# Patient Record
Sex: Male | Born: 1940 | Race: Black or African American | Hispanic: No | Smoking: Former smoker
Health system: Southern US, Community
[De-identification: ages and names within clinical notes are randomized; demographics above are authoritative.]

## PROBLEM LIST (undated history)

## (undated) DIAGNOSIS — N184 Chronic kidney disease, stage 4 (severe): Secondary | ICD-10-CM

## (undated) DIAGNOSIS — J449 Chronic obstructive pulmonary disease, unspecified: Secondary | ICD-10-CM

## (undated) DIAGNOSIS — Z8673 Personal history of transient ischemic attack (TIA), and cerebral infarction without residual deficits: Secondary | ICD-10-CM

## (undated) DIAGNOSIS — I5022 Chronic systolic (congestive) heart failure: Secondary | ICD-10-CM

## (undated) DIAGNOSIS — I10 Essential (primary) hypertension: Secondary | ICD-10-CM

## (undated) HISTORY — DX: Chronic kidney disease, stage 4 (severe): N18.4

## (undated) HISTORY — DX: Chronic systolic (congestive) heart failure: I50.22

## (undated) HISTORY — DX: Personal history of transient ischemic attack (TIA), and cerebral infarction without residual deficits: Z86.73

---

## 2005-02-08 ENCOUNTER — Other Ambulatory Visit: Payer: Self-pay

## 2005-02-08 ENCOUNTER — Emergency Department: Payer: Self-pay | Admitting: Unknown Physician Specialty

## 2008-06-05 ENCOUNTER — Emergency Department: Payer: Self-pay | Admitting: Emergency Medicine

## 2008-06-06 ENCOUNTER — Emergency Department: Payer: Self-pay | Admitting: Emergency Medicine

## 2008-08-25 ENCOUNTER — Emergency Department: Payer: Self-pay | Admitting: Emergency Medicine

## 2009-12-30 ENCOUNTER — Inpatient Hospital Stay: Payer: Medicare Other | Admitting: Internal Medicine

## 2010-01-01 ENCOUNTER — Encounter: Payer: Self-pay | Admitting: Cardiovascular Disease

## 2010-01-07 ENCOUNTER — Ambulatory Visit: Payer: Self-pay | Admitting: Cardiovascular Disease

## 2010-01-07 DIAGNOSIS — R0602 Shortness of breath: Secondary | ICD-10-CM | POA: Insufficient documentation

## 2010-01-07 DIAGNOSIS — I429 Cardiomyopathy, unspecified: Secondary | ICD-10-CM | POA: Insufficient documentation

## 2010-01-07 DIAGNOSIS — R9431 Abnormal electrocardiogram [ECG] [EKG]: Secondary | ICD-10-CM | POA: Insufficient documentation

## 2010-01-12 ENCOUNTER — Emergency Department: Payer: Medicare Other | Admitting: Emergency Medicine

## 2010-02-07 ENCOUNTER — Emergency Department: Payer: Medicare Other | Admitting: Emergency Medicine

## 2010-02-10 ENCOUNTER — Inpatient Hospital Stay: Payer: Medicare Other | Admitting: Internal Medicine

## 2010-02-10 ENCOUNTER — Ambulatory Visit: Payer: Self-pay | Admitting: Cardiovascular Disease

## 2010-02-11 ENCOUNTER — Encounter: Payer: Self-pay | Admitting: Cardiovascular Disease

## 2010-02-12 ENCOUNTER — Encounter: Payer: Self-pay | Admitting: Cardiovascular Disease

## 2010-02-19 ENCOUNTER — Ambulatory Visit: Payer: Self-pay | Admitting: Cardiovascular Disease

## 2010-02-19 DIAGNOSIS — I2589 Other forms of chronic ischemic heart disease: Secondary | ICD-10-CM | POA: Insufficient documentation

## 2010-02-21 ENCOUNTER — Telehealth (INDEPENDENT_AMBULATORY_CARE_PROVIDER_SITE_OTHER): Payer: Self-pay | Admitting: Physician Assistant

## 2010-02-22 ENCOUNTER — Emergency Department: Payer: Medicare Other | Admitting: Emergency Medicine

## 2010-02-23 ENCOUNTER — Telehealth: Payer: Self-pay | Admitting: Cardiovascular Disease

## 2010-03-16 ENCOUNTER — Telehealth: Payer: Self-pay | Admitting: Cardiovascular Disease

## 2010-03-23 ENCOUNTER — Emergency Department: Payer: Medicare Other | Admitting: Emergency Medicine

## 2010-04-27 ENCOUNTER — Ambulatory Visit: Payer: Self-pay | Admitting: Cardiology

## 2010-04-27 ENCOUNTER — Encounter: Payer: Self-pay | Admitting: Cardiovascular Disease

## 2010-04-27 ENCOUNTER — Inpatient Hospital Stay: Payer: Medicare Other | Admitting: Internal Medicine

## 2010-05-01 ENCOUNTER — Encounter: Payer: Self-pay | Admitting: Cardiovascular Disease

## 2010-05-02 ENCOUNTER — Ambulatory Visit: Payer: Medicare Other | Admitting: Internal Medicine

## 2010-05-06 ENCOUNTER — Telehealth: Payer: Self-pay | Admitting: Cardiovascular Disease

## 2010-05-09 ENCOUNTER — Ambulatory Visit: Payer: Self-pay | Admitting: Cardiovascular Disease

## 2010-05-09 ENCOUNTER — Inpatient Hospital Stay: Payer: Medicare Other | Admitting: Internal Medicine

## 2010-05-10 ENCOUNTER — Encounter: Payer: Self-pay | Admitting: Cardiovascular Disease

## 2010-05-28 ENCOUNTER — Ambulatory Visit: Payer: Self-pay | Admitting: Cardiovascular Disease

## 2010-06-02 ENCOUNTER — Ambulatory Visit: Payer: Medicare Other | Admitting: Internal Medicine

## 2010-06-03 ENCOUNTER — Telehealth: Payer: Self-pay | Admitting: Cardiovascular Disease

## 2010-07-31 ENCOUNTER — Telehealth: Payer: Self-pay | Admitting: Cardiovascular Disease

## 2010-09-01 NOTE — Letter (Signed)
Summary: [facility] - MRI Brain W/o Contrast  [facility] - MRI W/o Contrast   Imported By: Marylou Mccoy 07/10/2010 11:33:33  _____________________________________________________________________  External Attachment:    Type:   Image     Comment:   External Document

## 2010-09-01 NOTE — Letter (Signed)
Summary: [facility] - US Carotid Doppler Bilateral  [facility] - US Carotid Doppler Bilateral   Imported By: Marylou Mccoy 07/10/2010 11:44:17  _____________________________________________________________________  External Attachment:    Type:   Image     Comment:   External Document

## 2010-09-01 NOTE — Progress Notes (Signed)
  Phone Note Call from Patient   Call For: Dr Dossie Arbour Reason for Call: Talk to Doctor Summary of Call: Pt called because he is not sleeping. This is an ongoing problem. His family MD has prescribed Ambien but he says is not working. Advised him to call primary MD about this. HE HAS NOT SMOKED IN 2 DAYS. Congratulated him on this and reinforced compliance with diet/Rx. No cardiac/heart failure issues at this time. Initial call taken by: Park Breed PA-C,  February 21, 2010 9:09 AM

## 2010-09-01 NOTE — Letter (Signed)
SummaryScientist, [facility]   [facility]   Imported By: Roderic Ovens 05/18/2010 10:53:27  _____________________________________________________________________  External Attachment:    Type:   Image     Comment:   External Document

## 2010-09-01 NOTE — Assessment & Plan Note (Signed)
Summary: EPH/GLC   Visit Type:  Follow-up Primary Provider:  no PCP at this time  CC:  s/p hospital visit at [facility]  He was discharged on February 12, 2010.  c/o not being able to sleep, feels drowsy, shortness of breath, and edema in ankles and feet.Marland Kitchen  History of Present Illness: Mr. 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 At that time his ACE inhibitor and Lasix were held. He was recently admitted to [facility] with chest pain and ruled in for a non-STEMI with significant elevation of his troponin to greater than 40.  Echocardiogram at that time showed ejection fraction approximately 10%. He had significant mitral valve regurgitation.he refused cardiac catheterization and stress testing. Stress testing last year at [facility] had shown no ischemia.  He presents today and states that overall he feels well though he does feel fatigue. He is complaining that his blood pressure is a little bit low. He has chronic pains in his thighs when he walks. By his report, he had a lower extremity arterial ultrasound at [facility] but did not show any significant stenoses. he takes Lasix p.r.n. for lower extremity edema. He does not have significant shortness of breath. He continues to smoke.     q Current Medications (verified): 1)  Aspirin Ec 325 Mg Tbec (Aspirin) .... Take One Tablet By Mouth Daily 2)  Carvedilol 12.5 Mg Tabs (Carvedilol) .... One Tablet Two Times A Day 3)  Furosemide 40 Mg Tabs (Furosemide) .Marland Kitchen.. 1 Tab As Needed 4)  Pacerone 200 Mg Tabs (Amiodarone Hcl) .... One Tablet Two Times A Day 5)  Hydralazine Hcl 50 Mg  Tabs (Hydralazine Hcl) .... One Tablet Three Times A Day 6)  Klor-Con 10 10 Meq Cr-Tabs (Potassium Chloride) .... One Tablet Once Daily 7)  Zolpidem Tartrate 5 Mg Tabs (Zolpidem Tartrate) .... 1/2 - 1 Tablet At Bedtime 8)  Isosorbide Mononitrate Cr 30 Mg Xr24h-Tab (Isosorbide Mononitrate) .Marland Kitchen.. 1 Once Daily 9)  Lasix 40 Mg Tabs (Furosemide) .... 1/2 Tablet Once Daily 10)  Nitrostat 0.4 Mg Subl (Nitroglycerin) .... As Needed  Allergies (verified): 1)  ! * Furosemide  Past History:  Past Medical History: Last updated: 01/07/2010 chest pain HTN  Past Surgical History: Last updated: 01/07/2010 never had any surgeries states pt  Family History: Last updated: 01/07/2010 Family History of Hypertension: Brother  Social History: Last updated: 01/07/2010 Retired  Divorced  Tobacco Use - Yes.  Alcohol Use - no Regular Exercise - yes Drug Use - no  Risk Factors: Exercise: yes (01/07/2010)  Risk Factors: Smoking Status: current (01/07/2010)  Review of Systems  The patient denies fever, weight loss, weight gain, vision loss, decreased hearing, hoarseness, chest pain, syncope, dyspnea on exertion, peripheral edema, prolonged cough, abdominal pain, incontinence, muscle weakness, depression, and enlarged lymph nodes.         B/L leg pain/thighs  Vital Signs:  Patient profile:   70 year old male Height:      68 inches Weight:      118 pounds BMI:     18.01 Pulse rate:   53 / minute BP sitting:   113 / 79  (left  arm) Cuff size:   regular  Vitals Entered By: Bishop Dublin, CMA (February 19, 2010 3:52 PM)  Physical Exam  General:  Well developed, well nourished, in no acute distress. Head:  normocephalic and atraumatic Neck:  Neck supple, no JVD. No masses, thyromegaly or abnormal cervical nodes. Chest Wall:  no deformities or breast masses noted Lungs:  mildly decreased breath sounds throughout bilaterally Heart:  Non-displaced PMI, chest non-tender; regular rate and rhythm,  S1, S2 without murmurs, rubs or gallops. Carotid upstroke normal, no bruit.  Pedals normal pulses. Trace edema on the left foot, no varicosities. Abdomen:  Bowel sounds positive; abdomen soft and non-tender without masses Msk:  Back normal, normal gait. Muscle strength and tone normal. Pulses:  pulses normal in all 4 extremities Extremities:  No clubbing or cyanosis. Neurologic:  Alert and oriented x 3. Skin:  Intact without lesions or rashes. Psych:  Normal affect.   Impression & Recommendations:  Problem # 1:  CARDIOMYOPATHY, ISCHEMIC (ICD-414.8) he has a severe ischemic heart disease with ejection fraction of 10%. Recent MI with severe elevation of his troponin, treated medically as he did not want a stress test or cardiac catheterization. He is afraid that any dye will hurt his kidneys and cause him to be on hemodialysis. He is questioning many of his medications we have talked to him at length about each one. We have not made any medication changes at this time. He did have significant nonsustained VT in the hospital he was started on amiodarone.  He denies any further chest pain.  He is reluctant to take a statin given his leg pain. I did mention that if he would like, we could repeat his lower extremity arterial ultrasound to confirm that he has no stenoses. He would like to think about this.  His updated medication list for this problem includes:    Aspirin Ec 325 Mg Tbec (Aspirin) .Marland Kitchen... Take one tablet by mouth daily    Carvedilol 12.5 Mg Tabs (Carvedilol) ..... One tablet two times a day    Furosemide 40 Mg Tabs (Furosemide) .Marland Kitchen... 1 tab as needed    Pacerone 200 Mg Tabs (Amiodarone hcl) ..... One tablet two times a day    Isosorbide Mononitrate Cr 30 Mg Xr24h-tab (Isosorbide mononitrate) .Marland Kitchen... 1 once daily    Lasix 40 Mg Tabs (Furosemide) .Marland Kitchen... 1/2 tablet once daily    Nitrostat 0.4 Mg Subl (Nitroglycerin) .Marland Kitchen... As needed  Other Orders: EKG w/ Interpretation  (93000)  Appended Document: EPH/GLC echocardiogram from July 2010 shows ejection fraction 10%, severe global hypokinesis, RV has moderately reduced systolic function, moderately dilated left atrium, moderate to severe MR, mild to moderate TR, severely elevated right ventricular systolic pressure consistent with severe pulmonary hypertension.  EKG from the hospital dated February 10, 2006 shows normal sinus rhythm, left axis deviation, ST and T wave abnormalities noted in anterolateral leads including B1, aVL

## 2010-09-01 NOTE — Letter (Signed)
Summary: Discharge Summary  Discharge Summary   Imported By: West Carbo 02/16/2010 08:19:07  _____________________________________________________________________  External Attachment:    Type:   Image     Comment:   External Document

## 2010-09-01 NOTE — Letter (Signed)
Summary: MR RELEASE  MR RELEASE   Imported By: Frazier Butt Chriscoe 01/05/2010 09:52:36  _____________________________________________________________________  External Attachment:    Type:   Image     Comment:   External Document

## 2010-09-01 NOTE — Progress Notes (Signed)
Summary: RX  pacerone,isosorbide,carvedilol,   Phone Note Refill Request Call back at Home Phone [phone] Message from:  Patient on March 16, 2010 9:08 AM  Refills Requested: Medication #1:  PACERONE 200 MG TABS one tablet two times a day  Medication #2:  CARVEDILOL 12.5 MG TABS one tablet two times a day  Medication #3:  ISOSORBIDE MONONITRATE CR 30 MG XR24H-TAB Take 1/2 tablet by mouth once a day   Notes: PT WAS TOLD TO CUT THIS IN HALF  Medication #4:  HYDRALAZINE HCL 50 MG TABS one tablet three times a day   Notes: PT WAS TOLD TO CUT TO 25 MG [facility] ON [address]  Initial call taken by: Harlon Flor,  March 16, 2010 9:10 AM    New/Updated Medications: HYDRALAZINE HCL 25 MG TABS (HYDRALAZINE HCL) one tablet three times a day Prescriptions: ISOSORBIDE MONONITRATE CR 30 MG XR24H-TAB (ISOSORBIDE MONONITRATE) Take 1/2 tablet by mouth once a day  #30 x 3   Entered by:   Bishop Dublin, CMA   Authorized by:   Dossie Arbour MD   Signed by:   Bishop Dublin, CMA on 03/16/2010   Method used:   Electronically to        [facility] [address] (retail)       [address]       [place], [place]  [address]       Ph: [phone]       Fax: [phone]   RxID:   [id number] HYDRALAZINE HCL 25 MG TABS (HYDRALAZINE HCL) one tablet three times a day  #90 x 3   Entered by:   Bishop Dublin, CMA   Authorized by:   Dossie Arbour MD   Signed by:   Bishop Dublin, CMA on 03/16/2010   Method used:   Electronically to        [facility] [address] (retail)       [address]       [place], [place]  [address]       Ph: [phone]       Fax: [phone]   RxID:   [id number] PACERONE 200 MG TABS (AMIODARONE HCL) one tablet two times a day  #60 x 6   Entered by:   Bishop Dublin, CMA   Authorized by:   Dossie Arbour MD   Signed by:   Bishop Dublin, CMA on 03/16/2010   Method used:    Electronically to        [facility] [address] (retail)       [address]       [place], [place]  [address]       Ph: [phone]       Fax: [phone]   RxID:   [id number] CARVEDILOL 12.5 MG TABS (CARVEDILOL) one tablet two times a day  #60 x 6   Entered by:   Bishop Dublin, CMA   Authorized by:   Dossie Arbour MD   Signed by:   Bishop Dublin, CMA on 03/16/2010   Method used:   Electronically to        [facility] [address] (retail)       [address]       [place], [place]  [address]  Ph: [phone]       Fax: [phone]   RxID:   [id number]  ]

## 2010-09-01 NOTE — Assessment & Plan Note (Signed)
Summary: F/U [facility]   Visit Type:  Follow-up Primary Provider:  no PCP at this time  CC:  F/U [facility].  Denies dizziness.Chris Howe  History of Present Illness: 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  He reports feeling well. He has no dizziness. He is unaware of which medications he is taking. he is participating in PT and now swallowing a regular diet. Still has some word finding difficulty. denies SOB at rest, some with exertion, no edema. He continues to smoke. Small weight gain.  Carotid u/s showed no significant stenosis  Echocardiogram at that time showed ejection fraction approximately 10%. He had significant mitral valve regurgitation.he refused cardiac catheterization and stress testing. Stress testing last year at [facility] had shown no ischemia.  EKG: NSR with rate of 80 bpm. ST and T wave ABN in leads V4 to V6, II, III, AVF    Current Medications (verified): 1)  Aspirin Ec 325 Mg Tbec (Aspirin) .... Take One Tablet By Mouth Daily 2)  Carvedilol 12.5 Mg Tabs (Carvedilol) .... One Tablet Two Times A Day 3)  Amiodarone Hcl 200 Mg Tabs (Amiodarone Hcl) .... One Tablet Once Daily 4)  Klor-Con 10 10 Meq Cr-Tabs (Potassium Chloride) .... One Tablet Once Daily 5)  Isosorbide Mononitrate Cr 30 Mg Xr24h-Tab (Isosorbide Mononitrate) .... Take One Tablet Once Daily 6)  Lasix 40 Mg Tabs (Furosemide) .... One Tablet Once Daily 7)  Nitrostat 0.4 Mg Subl (Nitroglycerin) .... As  Needed 8)  Combivent 18-103 Mcg/act Aero (Ipratropium-Albuterol) .... Two Puffs Four Times Daily 9)  Zocor 10 Mg Tabs (Simvastatin) .... One Tablet At Bedtime 10)  Gabapentin 300 Mg Caps (Gabapentin) .... One Tablet Three Times A Day 11)  Midodrine Hcl 10 Mg Tabs (Midodrine Hcl) .... One Tablet Three Times A Day As Needed  Allergies (verified): 1)  ! * Furosemide  Past History:  Past Medical History: Last updated: 01/07/2010 chest pain HTN  Past Surgical History: Last updated: 01/07/2010 never had any surgeries states pt  Family History: Last updated: 01/07/2010 Family History of Hypertension: Brother  Social History: Last updated: 01/07/2010 Retired  Divorced  Tobacco Use - Yes.  Alcohol Use - no Regular Exercise - yes Drug Use - no  Risk Factors: Exercise: yes (01/07/2010)  Risk Factors: Smoking Status: current (01/07/2010)  Review of Systems       The patient complains of weight gain/loss and shortness of breath.  The patient denies fever, vision loss, decreased hearing, hoarseness, chest pain, palpitations, prolonged cough, abdominal pain, incontinence, muscle weakness, depression, and enlarged lymph nodes.    Vital Signs:  Patient profile:   70 year old male Height:      68 inches Weight:      116 pounds BMI:     17.70 Pulse rate:   80 / minute BP sitting:   131 / 77  (left arm) Cuff size:   regular  Vitals Entered By: Bishop Dublin, CMA (May 28, 2010 9:54 AM)  Physical Exam  General:  Thin,  ill appearing AA male, some mild speech ABN, walks with walker though can walk without walker.  Head:  normocephalic and atraumatic Neck:  Neck supple, no JVD. No masses, thyromegaly or abnormal cervical nodes. Chest Wall:  no deformities or breast masses noted Lungs:  mildly decreased breath sounds throughout bilaterally Heart:  Non-displaced PMI, chest non-tender; regular rate and rhythm, S1, S2 with II/VI SEM RSB, rubs or gallops. Carotid upstroke normal,  no bruit.  Pedals normal pulses. no edema , no varicosities. Abdomen:  Bowel sounds positive; abdomen soft and non-tender without masses   Thin Msk:  Back normal, normal gait. Muscle strength and tone normal. Pulses:  pulses normal in all 4 extremities Extremities:  No clubbing or cyanosis. Neurologic:  Alert and oriented x 3. Skin:  Intact without lesions or rashes. Psych:  Normal affect.   Impression & Recommendations:  Problem # 1:  CARDIOMYOPATHY, ISCHEMIC (ICD-414.8) severe LV dysfunction. It is uncertain which medications he is taking and we have asked him to call us today when he gets home with the list. He does not appear to be fluid overloaded on clinical exam.  The following medications were removed from the medication list:    Furosemide 40 Mg Tabs (Furosemide) .Chris Howe... 1 tab as needed His updated medication list for this problem includes:    Aspirin Ec 325 Mg Tbec (Aspirin) .Chris Howe... Take one tablet by mouth daily    Carvedilol 12.5 Mg Tabs (Carvedilol) ..... One tablet two times a day    Amiodarone Hcl 200 Mg Tabs (Amiodarone hcl) ..... One tablet once daily    Isosorbide Mononitrate Cr 30 Mg Xr24h-tab (Isosorbide mononitrate) .Chris Howe... Take one tablet once daily    Lasix 40 Mg Tabs (Furosemide) ..... One tablet once daily    Nitrostat 0.4 Mg Subl (Nitroglycerin) .Chris Howe... As needed  Problem # 2:  DYSPNEA (ICD-786.05) His breathing is at his baseline and he denies and dizziness. Unable to adjust meds as we do not know what he is taking. History of noncompliance.  The following medications were removed from the medication list:    Furosemide 40 Mg Tabs (Furosemide) .Chris Howe... 1 tab as needed His updated medication list for this problem includes:    Aspirin Ec 325 Mg Tbec (Aspirin) .Chris Howe... Take one tablet by mouth daily    Carvedilol 12.5 Mg Tabs (Carvedilol) ..... One tablet two times a day    Lasix 40 Mg Tabs (Furosemide) ..... One tablet once daily  Other Orders: EKG w/ Interpretation  (93000)

## 2010-09-01 NOTE — Letter (Signed)
Summary: Historic Patient File  Historic Patient File   Imported By: West Carbo 01/08/2010 09:41:32  _____________________________________________________________________  External Attachment:    Type:   Image     Comment:   External Document

## 2010-09-01 NOTE — Assessment & Plan Note (Signed)
Summary: NEW PT   Visit Type:  new pt visit Primary Provider:  no PCP at this time  CC:  pt quit smoking yesterday...cp...pt c/o leg pain when he walks.  History of Present Illness: Chris Howe is a pleasant 70 year old gentleman with a long history of smoking from age 42, for a total of 50 or more years, chronic renal insufficiency by his report, a cardiomyopathy with ejection fraction of 15% per his report who has been evaluated previously at [facility] for his cardiac issues and is followed at the [facility] with recent evaluation at [facility] for cardiac dysfunction and dehydration. At that time his ACE inhibitor and Lasix were held.  He states that he is unable to tolerate the diuretic and ACE inhibitor. Able to tolerate carvedilol. He does complain of some shortness of breath. He did not want any cardiac catheterization given his renal dysfunction while he was at [facility] and it is uncertain if he completed a stress test to rule out ischemia as a cause of his cardiac dysfunction.   He is now off all diuretic, on no ACE inhibitor and is only taking Coreg and aspirin.  he stopped smoking yesterday.  Preventive Screening-Counseling & Management  Alcohol-Tobacco     Smoking Status: current  Caffeine-Diet-Exercise     Does Patient Exercise: yes      Drug Use:  no.    Current Medications (verified): 1)  Aspirin Ec 325 Mg Tbec (Aspirin) .... Take One Tablet By Mouth Daily 2)  Carvedilol 25 Mg Tabs (Carvedilol) .Marland Kitchen.. 1 Tab Two Times A Day 3)  Furosemide 40 Mg Tabs (Furosemide) .Marland Kitchen.. 1 Tab As Needed  Allergies (verified): 1)  ! * Furosemide  Past History:  Family History: Last updated: 01/07/2010 Family History of Hypertension: Brother  Social History: Last updated: 01/07/2010 Retired  Divorced  Tobacco Use - Yes.  Alcohol Use - no Regular Exercise - yes Drug Use - no  Risk Factors: Exercise: yes (01/07/2010)  Risk Factors: Smoking  Status: current (01/07/2010)  Past Medical History: chest pain HTN  Past Surgical History: never had any surgeries states pt  Family History: Family History of Hypertension: Brother  Social History: Retired  Divorced  Tobacco Use - Yes.  Alcohol Use - no Regular Exercise - yes Drug Use - no Smoking Status:  current Does Patient Exercise:  yes Drug Use:  no  Vital Signs:  Patient profile:   70 year old male Height:      68 inches Weight:      117 pounds BMI:     17.85 Pulse rate:   62 / minute Pulse rhythm:   irregular BP sitting:   117 / 85  (left arm) Cuff size:   regular  Vitals Entered By: Danielle Rankin, CMA (January 07, 2010 10:24 AM)  Physical Exam  General:  Well developed, thin,  in no acute distress. Head:  normocephalic and atraumatic Neck:  Neck supple, no JVD. No masses, thyromegaly or abnormal cervical nodes. Chest Wall:  no deformities or breast masses noted Lungs:  Decreased BS milldy throughtout b/l Heart:  Non-displaced PMI, chest non-tender; regular rate and rhythm, S1, S2 without murmurs, rubs or gallops. Carotid upstroke normal, no bruit.  Pedals normal pulses. No edema, no varicosities. Abdomen:  Bowel sounds positive; abdomen soft and non-tender without masses, thin Msk:  Back normal, normal gait. Muscle strength and tone normal. Pulses:  pulses normal in all 4 extremities Extremities:  No clubbing  or cyanosis. Neurologic:  Alert and oriented x 3. Skin:  Intact without lesions or rashes. Psych:  Normal affect.    EKG  Procedure date:  01/07/2010  Findings:      normal sinus rhythm with rate 62 beats per minute, poor R-wave progression through the precordial leads, ST and T wave abnormality noted in V5, V6, leads 2, 3, aVF  Impression & Recommendations:  Problem # 1:  CARDIOMYOPATHY, SECONDARY (ICD-425.9) patient reports ejection fraction of 15%. It is uncertain if this is a nonischemic or ischemic cardiomyopathy. We will get the records  from [facility] to see if he had a stress test. He refused a catheterization at [facility] by his report as he was worried about his kidney function  We have not made any medication changes at this time and will continue aspirin and carvedilol until we can Review his records.  His updated medication list for this problem includes:    Aspirin Ec 325 Mg Tbec (Aspirin) .Marland Kitchen... Take one tablet by mouth daily    Carvedilol 25 Mg Tabs (Carvedilol) .Marland Kitchen... 1 tab two times a day    Furosemide 40 Mg Tabs (Furosemide) .Marland Kitchen... 1 tab as needed  Problem # 2:  DYSPNEA (ICD-786.05) It is uncertain if his shortness of breath is due to his cardiomyopathy and underlying heart failure or is 50 years of smoking. Per his report, he does not need Lasix as it makes him dehydrated. He only takes this p.r.n.  His updated medication list for this problem includes:    Aspirin Ec 325 Mg Tbec (Aspirin) .Marland Kitchen... Take one tablet by mouth daily    Carvedilol 25 Mg Tabs (Carvedilol) .Marland Kitchen... 1 tab two times a day    Furosemide 40 Mg Tabs (Furosemide) .Marland Kitchen... 1 tab as needed PRN  Problem # 3:  ABNORMAL EKG (ICD-794.31) EKG suggests a cardiomyopathy, LVH. We will try to obtain his echocardiogram.  His updated medication list for this problem includes:    Aspirin Ec 325 Mg Tbec (Aspirin) .Marland Kitchen... Take one tablet by mouth daily    Carvedilol 25 Mg Tabs (Carvedilol) .Marland Kitchen... 1 tab two times a day  Patient Instructions: 1)  Your physician recommends that you schedule a follow-up appointment in: 6 months 2)  Your physician recommends that you continue on your current medications as directed. Please refer to the Current Medication list given to you today.

## 2010-09-01 NOTE — Progress Notes (Signed)
Summary: MEDICATION  Phone Note Call from Patient Call back at Home Phone [phone]   Caller: SELF Call For: [facility] Summary of Call: PT WAS DISCHARGED FROM THE HOSPITAL WITH A MEDICATION THAT HE HAS NEVER TAKEN BEFORE-IMDUR (?)-PT HAS QUESTIONS ABOUT THIS Initial call taken by: Harlon Flor,  May 06, 2010 8:38 AM  Follow-up for Phone Call        Pt questioned if he should be taking imdur. pt seen in office Aug new prescription called to isorobide pt never picked up.  Follow-up by: Benedict Needy, RN,  May 06, 2010 12:30 PM

## 2010-09-01 NOTE — Letter (Signed)
Summary: Historic Patient File  Historic Patient File   Imported By: West Carbo 02/11/2010 09:03:06  _____________________________________________________________________  External Attachment:    Type:   Image     Comment:   External Document

## 2010-09-01 NOTE — Progress Notes (Signed)
Summary: Post hospital   Phone Note Call from Patient   Caller: Patient Call For: Dr. Mariah Milling Summary of Call: Pt called stating that you saw him in the hospital this weekend and he was to call office Monday morning.  He was seen in office last week so he declined making an appointment at this time. How can i help him? Initial call taken by: Benedict Needy, RN,  February 23, 2010 8:08 AM  Follow-up for Phone Call        He was seen in the ER. He did not feel well. Tests confirmed his BP was fine. He wanted lower doses of medication to take. At his request, we cut his hydralazine in 1/2 to 25 mg by mouth three times a day. No follow up needed for 6 months     Appended Document: Post hospital  LMOM TCB  Appended Document: Post hospital  Patient called back still having dizziness with cutting the hydralazine back to 25mg  three times a day.  What to do now?  Appended Document: Post hospital  His blood pressure and heart rate are fine.Uncertain what his dizziness is due to. Could try to cut the isosorbide in 1/2 He has terrible cardiac function and there is no cure as he has refused cardiac cath.  Appended Document: Post hospital     Clinical Lists Changes   Medications: Changed medication from ISOSORBIDE MONONITRATE CR 30 MG XR24H-TAB (ISOSORBIDE MONONITRATE) 1 once daily to ISOSORBIDE MONONITRATE CR 30 MG XR24H-TAB (ISOSORBIDE MONONITRATE) Take 1/2 tablet by mouth once a day  called and spoke to pt is aware of medication change

## 2010-09-01 NOTE — Progress Notes (Signed)
Summary: Medications  Phone Note Outgoing Call   Call placed by: Benedict Needy, RN,  June 03, 2010 10:56 AM Call placed to: Patient Summary of Call: After visit on 10/27 pt was to call office with medications b/c he did not bring the meds or a list into his visit. LMOM TCB  Initial call taken by: Benedict Needy, RN,  June 03, 2010 10:59 AM  Follow-up for Phone Call        Attempted to call pt but message said that voice mail box was full. Benedict Needy, RN  June 04, 2010 11:22 AM   Pt did not have time to talk about medications at this time will call back. Benedict Needy, RN  June 05, 2010 2:17 PM   Called spoke with pt, he is currently residing in a nursing home, and is currently under going rehab.  Will be discharged on Friday and he will call and update med list after discharge. Follow-up by: Cloyde Reams RN,  June 09, 2010 3:29 PM     Appended Document: Medications Patient is now taking Carvedilol 3.125mg  one tablet two times a day, furosemide 40 mg one tablet once daily,  lipitor 40 mg one tablet at bedtime, hydralazine 10 mg one tablet every 6 hours, plavix 75 mg one tablet once daily and the gabapentin 100 mg two capsule three times a day; patient is going to stop the gabapentin because he was sleeping too much.  He would like to know if needs potassium with fluid pill because he was taking before discharged but no potassium on discharge sheet.     Appended Document: Medications Medications stopped aspirin 325mg , isosorbide, pacerone, meclizine.          Appended Document: Medications Would probably start KCL 20 meq daily  Appended Document: Medications Notified patient to start KCL 20 meq one tablet once daily.  Appended Document: Medications Patient notified.  Appended Document: Medications Can we contact [facility]. Are they still following him? [phone] His meds he is taking is different from d/c list from 04/2010 [facility]. If they are  following, did he stop meds on his own/ What is he taking now?  Appended Document: Medications Attempted to contact [facility], LMOM TCB. EWJ  Appended Document: Medications Spoke with [facility], RN pt went to [facility] for rehab after last hospitalization.  Medication list currently per her list is: Coreg 3.125 two times a day, Gabapentin 200mg  three times a day, Hydralazine 10mg  every 6 hrs, lasix 40mg  two times a day, lipitor 40mg  once daily, and Plavix 75mg  once daily s/p discharge home for [facility]'s rehab.  Appended Document: Medications looks good for now.

## 2010-09-01 NOTE — Consult Note (Signed)
Summary: [facility] Consult Note  [facility] Consult Note   Imported By: Roderic Ovens 05/08/2010 13:39:54  _____________________________________________________________________  External Attachment:    Type:   Image     Comment:   External Document

## 2010-09-03 NOTE — Progress Notes (Signed)
Summary: RX  Phone Note Refill Request Call back at Home Phone [phone] Message from:  Patient on July 31, 2010 3:43 PM  Refills Requested: Medication #1:  KLOR-CON 10 10 MEQ CR-TABS one tablet once daily  Medication #2:  HYDRALAZINE 10 MG 1 TABLET EVERY 6 HOURS  Medication #3:  PLAVIX 75 MG  Medication #4:  LIPITOR 40 MG CARVEDILOL 3.125 MG-2 TIMES A DAY.  [facility] ON [address]  Initial call taken by: Harlon Flor,  July 31, 2010 3:45 PM  Follow-up for Phone Call        per phone note on 06/03/10 up to date medication list updated in computer and medications will be called in for refills. Follow-up by: Lysbeth Galas CMA,  July 31, 2010 5:02 PM    New/Updated Medications: CARVEDILOL 3.125 MG TABS (CARVEDILOL) 1 tablet two times a day HYDRALAZINE HCL 10 MG TABS (HYDRALAZINE HCL) 1 tablet every 6 hours PLAVIX 75 MG TABS (CLOPIDOGREL BISULFATE) 1 tablet daily LIPITOR 40 MG TABS (ATORVASTATIN CALCIUM) 1 tablet daily Prescriptions: LIPITOR 40 MG TABS (ATORVASTATIN CALCIUM) 1 tablet daily  #30 x 3   Entered by:   Lysbeth Galas CMA   Authorized by:   Dossie Arbour MD   Signed by:   Lysbeth Galas CMA on 07/31/2010   Method used:   Electronically to        [facility] [address] (retail)       [address]       [place], [place]  [address]       Ph: [phone]       Fax: [phone]   RxID:   [id number] PLAVIX 75 MG TABS (CLOPIDOGREL BISULFATE) 1 tablet daily  #30 x 3   Entered by:   Lysbeth Galas CMA   Authorized by:   Dossie Arbour MD   Signed by:   Lysbeth Galas CMA on 07/31/2010   Method used:   Electronically to        [facility] [address] (retail)       [address]       [place], [place]  [address]       Ph: [phone]       Fax: [phone]   RxID:   [id number] HYDRALAZINE HCL 10 MG TABS (HYDRALAZINE HCL) 1 tablet every 6 hours  #60 x 3   Entered by:    Lysbeth Galas CMA   Authorized by:   Dossie Arbour MD   Signed by:   Lysbeth Galas CMA on 07/31/2010   Method used:   Electronically to        [facility] [address] (retail)       [address]       [place], [place]  [address]       Ph: [phone]       Fax: [phone]   RxID:   [id number] KLOR-CON 10 10 MEQ CR-TABS (POTASSIUM CHLORIDE) one tablet once daily  #30 x 3   Entered by:   Lysbeth Galas CMA   Authorized by:   Dossie Arbour MD   Signed by:   Lysbeth Galas CMA on 07/31/2010   Method used:   Electronically to        [facility] [address] (retail)       [address]  [place], [place]  [address]       Ph: [phone]       Fax: [phone]   RxID:   [id number] CARVEDILOL 3.125 MG TABS (CARVEDILOL) 1 tablet two times a day  #60 x 3   Entered by:   Lysbeth Galas CMA   Authorized by:   Dossie Arbour MD   Signed by:   Lysbeth Galas CMA on 07/31/2010   Method used:   Electronically to        [facility] [address] (retail)       [address]       [place], [place]  [address]       Ph: [phone]       Fax: [phone]   RxID:   [id number]

## 2010-10-14 ENCOUNTER — Telehealth: Payer: Self-pay | Admitting: Cardiovascular Disease

## 2010-10-19 ENCOUNTER — Encounter: Payer: Self-pay | Admitting: Cardiovascular Disease

## 2010-10-20 NOTE — Progress Notes (Signed)
Summary: RX  Phone Note Refill Request Call back at Home Phone [phone] Message from:  Patient on October 14, 2010 10:06 AM  Refills Requested: Medication #1:  COMBIVENT 18-103 MCG/ACT AERO two puffs four times daily [facility] [address]  Initial call taken by: Harlon Flor,  October 14, 2010 10:07 AM  Follow-up for Phone Call        LMOM TCB. Lysbeth Galas CMA  October 14, 2010 2:02 PM   pt notified he needs to contact PCP for refill Lysbeth Galas CMA  October 15, 2010 11:18 AM

## 2010-11-04 ENCOUNTER — Encounter: Payer: Self-pay | Admitting: Cardiovascular Disease

## 2010-11-04 ENCOUNTER — Ambulatory Visit (INDEPENDENT_AMBULATORY_CARE_PROVIDER_SITE_OTHER): Payer: Medicare Other | Admitting: Cardiovascular Disease

## 2010-11-04 DIAGNOSIS — I2589 Other forms of chronic ischemic heart disease: Secondary | ICD-10-CM

## 2010-11-04 DIAGNOSIS — R42 Dizziness and giddiness: Secondary | ICD-10-CM

## 2010-11-04 DIAGNOSIS — I10 Essential (primary) hypertension: Secondary | ICD-10-CM

## 2010-11-04 DIAGNOSIS — IMO0001 Reserved for inherently not codable concepts without codable children: Secondary | ICD-10-CM | POA: Insufficient documentation

## 2010-11-04 DIAGNOSIS — E785 Hyperlipidemia, unspecified: Secondary | ICD-10-CM

## 2010-11-04 DIAGNOSIS — F17200 Nicotine dependence, unspecified, uncomplicated: Secondary | ICD-10-CM

## 2010-11-04 DIAGNOSIS — R0602 Shortness of breath: Secondary | ICD-10-CM

## 2010-11-04 NOTE — Assessment & Plan Note (Signed)
He continues to smoke one pack per day. We have counseled him on smoking cessation.

## 2010-11-04 NOTE — Assessment & Plan Note (Signed)
I suggested that he take his Imdur later in the day in an attempt to improve his dizziness. Uncertain if this is secondary to hypotension. Does not appear to be hypotensive today and he reports having symptoms this morning.

## 2010-11-04 NOTE — Assessment & Plan Note (Signed)
He does have mild shortness of breath. Ejection fraction is 10-15% by history. No signs of heart failure at this time. Continue current regimen.

## 2010-11-04 NOTE — Assessment & Plan Note (Signed)
Would continue Lipitor as he is taking. Goal LDL less than 70.

## 2010-11-04 NOTE — Assessment & Plan Note (Signed)
He has poor compensated ischemic cardiomyopathy. We will continue his current medication regimen. We have stressed medication compliance

## 2010-11-04 NOTE — Progress Notes (Signed)
   Patient ID: Chris Howe, male    DOB: 07-Aug-1940, 70 y.o.   MRN: [id number]  HPI Comments: Chris Howe is a pleasant 69 year old gentleman with a long history of smoking from age 8, for a total of 50 or more years, chronic renal insufficiency, ischemic cardiomyopathy with ejection fraction of 15% who has been evaluated previously at [facility] for his cardiac issues and is followed at the [facility] with evaluation at [facility] for cardiac dysfunction and dehydration, repeat admission for NSTEMI at which time he refused cardiac cath,  repeat admission on Oct 9th 2011 for CVA, Several recent admissions to [facility] early records are unavailable to [place]. He reports having a urine infection.  In the past, he was very medication noncompliant. He reports taking his medications as prescribed from [facility] though he does have dizziness. He has taken his Coreg and hydralazine today though has not taken Imdur. Overall he feels well with no significant shortness of breath and has no complaints. Is able to lie flat, has no edema.  Carotid u/s showed no significant stenosis   He has significant mitral valve regurgitation. he refused cardiac catheterization and stress testing.  Stress testing last year at [facility] had shown no ischemia.  EKG: NSR with rate of 66 bpm. T Wave abnormality in leads V5, V6, 2, 3, aVF, nonspecific ST changes, poor R wave progression through the precordial leads, LVH by voltage criteria     Review of Systems  Constitutional: Negative.   HENT: Negative.   Eyes: Negative.   Respiratory: Negative.   Cardiovascular: Negative.   Gastrointestinal: Negative.   Musculoskeletal: Negative.   Skin: Negative.   Neurological: Positive for dizziness.  Hematological: Negative.   Psychiatric/Behavioral: Negative.   All other systems reviewed and are negative.    BP 122/78  Pulse 66  Ht 5\' 8"  (1.727 m)  Wt 114 lb (51.71 kg)  BMI 17.33 kg/m2   Physical  Exam  Nursing note and vitals reviewed. Constitutional: He is oriented to person, place, and time.       Very thin.  HENT:  Head: Normocephalic.  Nose: Nose normal.  Mouth/Throat: Oropharynx is clear and moist.  Eyes: Conjunctivae are normal. Pupils are equal, round, and reactive to light.  Neck: Normal range of motion. Neck supple. No JVD present.  Cardiovascular: Normal rate, regular rhythm, S1 normal, S2 normal and intact distal pulses.  Exam reveals gallop. Exam reveals no friction rub.   Murmur heard.  Crescendo systolic murmur is present with a grade of 2/6  Pulmonary/Chest: Effort normal and breath sounds normal. No respiratory distress. He has no wheezes. He has no rales. He exhibits no tenderness.  Abdominal: Soft. Bowel sounds are normal. He exhibits no distension. There is no tenderness.  Musculoskeletal: Normal range of motion. He exhibits no edema and no tenderness.  Lymphadenopathy:    He has no cervical adenopathy.  Neurological: He is alert and oriented to person, place, and time. Coordination normal.  Skin: Skin is warm and dry. No rash noted. No erythema.  Psychiatric: He has a normal mood and affect. His behavior is normal. Judgment and thought content normal.           Assessment and Plan

## 2010-11-04 NOTE — Patient Instructions (Signed)
Continue current medications.  Your physician recommends that you schedule a follow-up appointment in: 6 months.  

## 2011-01-18 ENCOUNTER — Emergency Department: Payer: Self-pay | Admitting: Emergency Medicine

## 2011-01-31 ENCOUNTER — Ambulatory Visit: Payer: Medicare Other | Admitting: Internal Medicine

## 2011-01-31 ENCOUNTER — Emergency Department: Payer: Self-pay | Admitting: Emergency Medicine

## 2011-02-01 ENCOUNTER — Other Ambulatory Visit: Payer: Self-pay | Admitting: Cardiovascular Disease

## 2011-02-07 ENCOUNTER — Inpatient Hospital Stay: Payer: Medicare Other | Admitting: Internal Medicine

## 2011-02-07 DIAGNOSIS — R0602 Shortness of breath: Secondary | ICD-10-CM

## 2011-02-07 DIAGNOSIS — I059 Rheumatic mitral valve disease, unspecified: Secondary | ICD-10-CM

## 2011-02-11 ENCOUNTER — Encounter: Payer: Self-pay | Admitting: Cardiovascular Disease

## 2011-02-16 ENCOUNTER — Ambulatory Visit (INDEPENDENT_AMBULATORY_CARE_PROVIDER_SITE_OTHER): Payer: Medicare Other | Admitting: Cardiovascular Disease

## 2011-02-16 ENCOUNTER — Encounter: Payer: Self-pay | Admitting: Cardiovascular Disease

## 2011-02-16 DIAGNOSIS — R42 Dizziness and giddiness: Secondary | ICD-10-CM

## 2011-02-16 DIAGNOSIS — F17200 Nicotine dependence, unspecified, uncomplicated: Secondary | ICD-10-CM

## 2011-02-16 DIAGNOSIS — R0602 Shortness of breath: Secondary | ICD-10-CM

## 2011-02-16 DIAGNOSIS — I2589 Other forms of chronic ischemic heart disease: Secondary | ICD-10-CM

## 2011-02-16 DIAGNOSIS — E785 Hyperlipidemia, unspecified: Secondary | ICD-10-CM

## 2011-02-16 NOTE — Assessment & Plan Note (Signed)
Shortness of breath is currently stable. We've asked him to maintain his current weight and watch his oral fluid intake him a maintain his Lasix dose with extra Lasix for weight increase.

## 2011-02-16 NOTE — Assessment & Plan Note (Signed)
We did talk to him about his smoking. Counseled for at least 10 minutes. Is not interested in patches or chantix.

## 2011-02-16 NOTE — Assessment & Plan Note (Signed)
Etiology of his dizziness is uncertain. Could be possibly secondary to one of the medications. Symptoms are very mild today and he is taking his medications, but did miss a dose today.

## 2011-02-16 NOTE — Progress Notes (Signed)
Patient ID: Chris Howe, male    DOB: 03-02-1941, 70 y.o.   MRN: [id number]  HPI Comments: Chris Howe is a pleasant 70 year old gentleman with a long history of smoking from age 58, for a total of 50 or more years, chronic renal insufficiency, ischemic cardiomyopathy with ejection fraction of 15% who has been evaluated previously at [facility] for his cardiac issues and is followed at the [facility] with evaluation at [facility] for cardiac dysfunction and dehydration, repeat admission for NSTEMI at which time he refused cardiac cath,  repeat admission on Oct 9th 2011 for CVA, Several admissions to [facility]  records are unavailable to [place]. He was recently admitted to a [facility] on July 1st for shortness of breath, Found to have systolic heart failure. Improved symptoms on diuresis.  He reports that he is taking his medications. In the past, he has been periodically compliant with his medications depending on how he feels. He did not take his medications today and reports his blood pressure was elevated yesterday and today. He denies any significant shortness of breath. He has been monitoring his weight at home. He denies any significant lower extremity edema.  Carotid u/s showed no significant stenosis  Echocardiogram showed ejection fraction 15%, severe global hypokinesisess testing.  Stress testing last year at [facility] had shown no ischemia., right ventricle moderately dilated, but trickier systolic function moderately reduced, left atrium moderately dilated, mild to moderate MR, moderate TR, right ventricular systolic pressure estimated at 50-60 mmHg    Outpatient Encounter Prescriptions as of 02/16/2011  Medication Sig Dispense Refill  . ALBUTEROL IN Inhale 0.09 mg into the lungs. Two puffs every two hours as needed for shortness of breath/wheezing        . aspirin 81 MG EC tablet Take 81 mg by mouth daily.        Marland Kitchen atorvastatin (LIPITOR) 40 MG tablet TAKE ONE TABLET  BY MOUTH EVERY DAY  30 tablet  6  . carvedilol (COREG) 12.5 MG tablet Take 12.5 mg by mouth 2 (two) times daily with a meal.        . clopidogrel (PLAVIX) 75 MG tablet Take 75 mg by mouth daily.        Marland Kitchen docusate sodium (COLACE) 100 MG capsule Take 100 mg by mouth 2 (two) times daily.        . furosemide (LASIX) 40 MG tablet Take 40 mg by mouth 1 day or 1 dose. Take one tablet daily.  If weight increased by more than 3 pounds in one day or more than 5 pounds/ week, please take an extra dose of furosemide 40 mg in the afternoon.      . gabapentin (NEURONTIN) 300 MG capsule Take 300 mg by mouth 1 dose over 46 hours.        . hydrALAZINE (APRESOLINE) 50 MG tablet Take 50 mg by mouth 2 (two) times daily.        . isosorbide mononitrate (IMDUR) 60 MG 24 hr tablet Take 60 mg by mouth daily.        Marland Kitchen lisinopril (PRINIVIL,ZESTRIL) 5 MG tablet Take 5 mg by mouth daily.        . nitroGLYCERIN (NITROSTAT) 0.4 MG SL tablet Place 0.4 mg under the tongue every 5 (five) minutes as needed.        . polyethylene glycol powder (MIRALAX) powder Take 17 g by mouth daily.           Review of  Systems  Constitutional: Negative.   HENT: Negative.   Eyes: Negative.   Respiratory: Negative.   Cardiovascular: Negative.   Gastrointestinal: Negative.   Musculoskeletal: Negative.   Skin: Negative.   Hematological: Negative.   Psychiatric/Behavioral: Negative.   All other systems reviewed and are negative.    BP 167/93  Pulse 76  Ht 5' 8.5" (1.74 m)  Wt 116 lb (52.617 kg)  BMI 17.38 kg/m2   Physical Exam  Nursing note and vitals reviewed. Constitutional: He is oriented to person, place, and time. He appears well-developed and well-nourished.       Very thin.  HENT:  Head: Normocephalic.  Nose: Nose normal.  Mouth/Throat: Oropharynx is clear and moist.  Eyes: Conjunctivae are normal. Pupils are equal, round, and reactive to light.  Neck: Normal range of motion. Neck supple. No JVD present.    Cardiovascular: Normal rate, regular rhythm, S1 normal, S2 normal and intact distal pulses.  Exam reveals no friction rub.   Murmur heard.  Crescendo systolic murmur is present with a grade of 2/6  Pulmonary/Chest: Effort normal and breath sounds normal. No respiratory distress. He has no wheezes. He has no rales. He exhibits no tenderness.  Abdominal: Soft. Bowel sounds are normal. He exhibits no distension. There is no tenderness.  Musculoskeletal: Normal range of motion. He exhibits no edema and no tenderness.  Lymphadenopathy:    He has no cervical adenopathy.  Neurological: He is alert and oriented to person, place, and time. Coordination normal.  Skin: Skin is warm and dry. No rash noted. No erythema.  Psychiatric: He has a normal mood and affect. His behavior is normal. Judgment and thought content normal.           Assessment and Plan

## 2011-02-16 NOTE — Assessment & Plan Note (Signed)
He has refused invasive workup in the past. Now with ischemic myopathy. Recent admission for systolic heart failure. Improved after diuresis and restarting his medications. He is very medication noncompliant, reporting that the medications make him dizzy despite adequate blood pressure and heart rate.

## 2011-02-16 NOTE — Patient Instructions (Signed)
You are doing well. No medication changes were made. Please call us if you have new issues that need to be addressed before your next appt.  We will call you for a follow up Appt. In 6 months  

## 2011-02-16 NOTE — Assessment & Plan Note (Signed)
We have encouraged him to stay on his cholesterol medication.

## 2011-02-17 ENCOUNTER — Other Ambulatory Visit: Payer: Self-pay | Admitting: Cardiovascular Disease

## 2011-03-03 ENCOUNTER — Ambulatory Visit: Payer: Medicare Other | Admitting: Internal Medicine

## 2011-03-03 ENCOUNTER — Emergency Department: Payer: Medicare Other | Admitting: Emergency Medicine

## 2011-04-21 ENCOUNTER — Telehealth: Payer: Self-pay | Admitting: *Deleted

## 2011-04-21 NOTE — Telephone Encounter (Signed)
Error

## 2011-10-25 ENCOUNTER — Emergency Department: Payer: Self-pay | Admitting: Emergency Medicine

## 2011-11-26 ENCOUNTER — Telehealth: Payer: Self-pay | Admitting: Physician Assistant

## 2011-11-26 NOTE — Telephone Encounter (Signed)
Pt called because he has a 4 day history of weakness and dizziness he feels is secondary to his meds. His weight is down, but not sure how much. His appetite is poor. He feels very bad and has not smiled in a year. He complains of suicidal thoughts and says he has had these symptoms since before he saw Dr Mariah Milling in July 2012. He did not tell Dr Mariah Milling about these problems.   I spent over 30 minutes encouraging the patient to call 911 and seek help at the closest emergency room. I also spoke to the patient's brother to enlist his aid in getting the patient to seek help. The patient stated he was not currently having suicidal thoughts and his brother stated he would try to talk the patient into getting help at the ER. Will send msg to ofc for Monday.

## 2011-12-25 ENCOUNTER — Emergency Department: Payer: Self-pay | Admitting: *Deleted

## 2012-02-08 ENCOUNTER — Inpatient Hospital Stay: Payer: Self-pay | Admitting: Internal Medicine

## 2012-02-08 LAB — COMPREHENSIVE METABOLIC PANEL
Alkaline Phosphatase: 93 U/L (ref 50–136)
BUN: 34 mg/dL — ABNORMAL HIGH (ref 7–18)
Bilirubin,Total: 0.4 mg/dL (ref 0.2–1.0)
Co2: 27 mmol/L (ref 21–32)
Creatinine: 2.69 mg/dL — ABNORMAL HIGH (ref 0.60–1.30)
EGFR (African American): 27 — ABNORMAL LOW
EGFR (Non-African Amer.): 23 — ABNORMAL LOW
Glucose: 116 mg/dL — ABNORMAL HIGH (ref 65–99)
SGOT(AST): 23 U/L (ref 15–37)
SGPT (ALT): 19 U/L
Total Protein: 6.2 g/dL — ABNORMAL LOW (ref 6.4–8.2)

## 2012-02-08 LAB — CBC
HCT: 38.9 % — ABNORMAL LOW (ref 40.0–52.0)
HGB: 12.5 g/dL — ABNORMAL LOW (ref 13.0–18.0)
MCH: 30.5 pg (ref 26.0–34.0)
MCHC: 32.2 g/dL (ref 32.0–36.0)
MCV: 95 fL (ref 80–100)
Platelet: 174 10*3/uL (ref 150–440)
RDW: 15.4 % — ABNORMAL HIGH (ref 11.5–14.5)
WBC: 4.3 10*3/uL (ref 3.8–10.6)

## 2012-02-08 LAB — URINALYSIS, COMPLETE
Bacteria: NONE SEEN
Blood: NEGATIVE
Ketone: NEGATIVE
Leukocyte Esterase: NEGATIVE
Nitrite: NEGATIVE
Protein: 100
Specific Gravity: 1.011 (ref 1.003–1.030)
Squamous Epithelial: 1
WBC UR: 1 /HPF (ref 0–5)

## 2012-02-08 LAB — MAGNESIUM: Magnesium: 2 mg/dL

## 2012-02-09 LAB — TROPONIN I
Troponin-I: 0.04 ng/mL
Troponin-I: 0.05 ng/mL

## 2012-02-09 LAB — CBC WITH DIFFERENTIAL/PLATELET
Basophil %: 0.7 %
Eosinophil #: 0.1 10*3/uL (ref 0.0–0.7)
HCT: 39.9 % — ABNORMAL LOW (ref 40.0–52.0)
HGB: 12.8 g/dL — ABNORMAL LOW (ref 13.0–18.0)
MCH: 30.5 pg (ref 26.0–34.0)
MCHC: 32.2 g/dL (ref 32.0–36.0)
Monocyte #: 0.3 x10 3/mm (ref 0.2–1.0)
Neutrophil #: 2.4 10*3/uL (ref 1.4–6.5)
Neutrophil %: 58.2 %
RDW: 15.4 % — ABNORMAL HIGH (ref 11.5–14.5)

## 2012-02-09 LAB — BASIC METABOLIC PANEL
Anion Gap: 7 (ref 7–16)
BUN: 35 mg/dL — ABNORMAL HIGH (ref 7–18)
Calcium, Total: 8.3 mg/dL — ABNORMAL LOW (ref 8.5–10.1)
Co2: 33 mmol/L — ABNORMAL HIGH (ref 21–32)
Creatinine: 2.82 mg/dL — ABNORMAL HIGH (ref 0.60–1.30)
EGFR (African American): 25 — ABNORMAL LOW
EGFR (Non-African Amer.): 22 — ABNORMAL LOW
Glucose: 115 mg/dL — ABNORMAL HIGH (ref 65–99)

## 2012-02-09 LAB — PHOSPHORUS: Phosphorus: 3.4 mg/dL (ref 2.5–4.9)

## 2012-08-26 ENCOUNTER — Emergency Department: Payer: Self-pay | Admitting: Emergency Medicine

## 2012-08-31 ENCOUNTER — Emergency Department: Payer: Self-pay | Admitting: Emergency Medicine

## 2012-08-31 LAB — URINALYSIS, COMPLETE
Bacteria: NONE SEEN
Bilirubin,UR: NEGATIVE
Nitrite: NEGATIVE
Protein: >=500
RBC,UR: 4 /HPF (ref 0–5)
Specific Gravity: 1.013 (ref 1.003–1.030)

## 2012-08-31 LAB — CK TOTAL AND CKMB (NOT AT ARMC)
CK, Total: 54 U/L (ref 35–232)
CK-MB: 1.1 ng/mL (ref 0.5–3.6)

## 2012-08-31 LAB — COMPREHENSIVE METABOLIC PANEL
Anion Gap: 10 (ref 7–16)
Chloride: 108 mmol/L — ABNORMAL HIGH (ref 98–107)
Co2: 24 mmol/L (ref 21–32)
Creatinine: 3.12 mg/dL — ABNORMAL HIGH (ref 0.60–1.30)
EGFR (African American): 22 — ABNORMAL LOW
EGFR (Non-African Amer.): 19 — ABNORMAL LOW
Osmolality: 307 (ref 275–301)
SGOT(AST): 34 U/L (ref 15–37)
SGPT (ALT): 38 U/L (ref 12–78)
Sodium: 142 mmol/L (ref 136–145)

## 2012-08-31 LAB — CBC
HGB: 12.3 g/dL — ABNORMAL LOW (ref 13.0–18.0)
RBC: 4.4 10*6/uL (ref 4.40–5.90)

## 2012-08-31 LAB — TROPONIN I: Troponin-I: 0.06 ng/mL — ABNORMAL HIGH

## 2012-09-09 ENCOUNTER — Emergency Department: Payer: Self-pay | Admitting: Emergency Medicine

## 2012-09-09 LAB — CBC
HCT: 36.6 % — ABNORMAL LOW (ref 40.0–52.0)
HGB: 11.8 g/dL — ABNORMAL LOW (ref 13.0–18.0)
MCH: 27.7 pg (ref 26.0–34.0)
MCHC: 32.1 g/dL (ref 32.0–36.0)
MCV: 86 fL (ref 80–100)
Platelet: 222 x10 3/mm 3 (ref 150–440)
RBC: 4.25 x10 6/mm 3 — ABNORMAL LOW (ref 4.40–5.90)
RDW: 17.2 % — ABNORMAL HIGH (ref 11.5–14.5)
WBC: 5.7 x10 3/mm 3 (ref 3.8–10.6)

## 2012-09-09 LAB — BASIC METABOLIC PANEL
Anion Gap: 7 (ref 7–16)
BUN: 50 mg/dL — ABNORMAL HIGH (ref 7–18)
Calcium, Total: 8.6 mg/dL (ref 8.5–10.1)
EGFR (African American): 29 — ABNORMAL LOW
EGFR (Non-African Amer.): 25 — ABNORMAL LOW
Osmolality: 298 (ref 275–301)
Sodium: 143 mmol/L (ref 136–145)

## 2012-09-12 ENCOUNTER — Ambulatory Visit (INDEPENDENT_AMBULATORY_CARE_PROVIDER_SITE_OTHER): Payer: Medicare Other | Admitting: Cardiovascular Disease

## 2012-09-12 ENCOUNTER — Encounter: Payer: Self-pay | Admitting: Cardiovascular Disease

## 2012-09-12 VITALS — BP 124/80 | HR 75 | Ht 68.5 in | Wt 111.2 lb

## 2012-09-12 DIAGNOSIS — I509 Heart failure, unspecified: Secondary | ICD-10-CM

## 2012-09-12 DIAGNOSIS — I5022 Chronic systolic (congestive) heart failure: Secondary | ICD-10-CM

## 2012-09-12 DIAGNOSIS — R0602 Shortness of breath: Secondary | ICD-10-CM

## 2012-09-12 DIAGNOSIS — R42 Dizziness and giddiness: Secondary | ICD-10-CM

## 2012-09-12 DIAGNOSIS — F17200 Nicotine dependence, unspecified, uncomplicated: Secondary | ICD-10-CM

## 2012-09-12 DIAGNOSIS — E785 Hyperlipidemia, unspecified: Secondary | ICD-10-CM

## 2012-09-12 DIAGNOSIS — I2589 Other forms of chronic ischemic heart disease: Secondary | ICD-10-CM

## 2012-09-12 NOTE — Assessment & Plan Note (Signed)
We have encouraged him to continue to work on weaning his cigarettes and smoking cessation. He will continue to work on this and does not want any assistance with chantix.  

## 2012-09-12 NOTE — Progress Notes (Signed)
Patient ID: Chris Howe, male    DOB: 04/21/1941, 72 y.o.   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 He was recently admitted to a [facility] on July 1st 2012for shortness of breath, Found to have systolic heart failure. Improved symptoms on diuresis.  Prior history of medication noncompliance, continued smoking, refusing testing such as cardiac cath. It appears that he was seen at [facility] emergency room every 8 2014. BUN and creatinine were 50 and 2.5 at that time Recent admission to [facility] 09/10/2012. Medications were restarted including Coreg, hydralazine, Plavix. He reports having weight gain prior to admission and feels better since discharge. Much of today's visit was spent on heart failure teaching.   Prior Carotid u/s showed no significant stenosis  Prior Echocardiogram showed ejection fraction 15%, severe global hypokinesisess testing.  Stress testing last year at [facility] had shown no ischemia., right ventricle moderately dilated, but trickier systolic function moderately reduced, left atrium moderately dilated, mild to moderate MR, moderate TR, right ventricular systolic pressure estimated at 50-60 mmHg  EKG shows normal sinus rhythm with rate 75 beats per minute with ST and T wave abnormality in the anterolateral leads consistent with anterolateral ischemia  Blood work from [facility] 02/09/2014Shows creatinine 2.34, pro BNP 39,000,    Outpatient Encounter Prescriptions as of 09/12/2012  Medication  Sig Dispense Refill  . ALBUTEROL IN Inhale 0.09 mg into the lungs. Two puffs every two hours as needed for shortness of breath/wheezing        . aspirin 325 MG tablet Take 325 mg by mouth daily.      Marland Kitchen atorvastatin (LIPITOR) 40 MG tablet TAKE ONE TABLET BY MOUTH EVERY DAY  30 tablet  6  . carvedilol (COREG) 25 MG tablet Take 25 mg by mouth 2 (two) times daily with a meal.      . docusate sodium (COLACE) 100 MG capsule Take 100 mg by mouth 2 (two) times daily.        . furosemide (LASIX) 40 MG tablet Take 40 mg by mouth 1 day or 1 dose. Take one tablet daily.  If weight increased by more than 3 pounds in one day or more than 5 pounds/ week, please take an extra dose of furosemide 40 mg in the afternoon.      . gabapentin (NEURONTIN) 300 MG capsule Take 300 mg by mouth 1 dose over 46 hours.        . hydrALAZINE (APRESOLINE) 50 MG tablet Take 50 mg by mouth 3 (three) times daily.       . isosorbide mononitrate (IMDUR) 30 MG 24 hr tablet Take 30 mg by mouth daily.      Marland Kitchen lisinopril (PRINIVIL,ZESTRIL) 20 MG tablet Take 20 mg by mouth daily.      . nitroGLYCERIN (NITROSTAT) 0.4 MG SL tablet Place 0.4 mg under the tongue every 5 (five) minutes as needed.        Marland Kitchen PLAVIX 75 MG tablet TAKE ONE TABLET BY MOUTH EVERY DAY  30 each  6  . polyethylene glycol powder (MIRALAX) powder Take 17 g by mouth daily.          Review of Systems  Constitutional: Negative.   HENT: Negative.   Eyes: Negative.   Respiratory: Negative.   Cardiovascular: Negative.   Gastrointestinal: Negative.   Musculoskeletal: Negative.   Skin: Negative.   Psychiatric/Behavioral: Negative.   All other systems reviewed and are negative.    BP 124/80  Pulse 75  Ht 5' 8.5" (1.74 m)  Wt 111 lb 4 oz (50.463 kg)  BMI 16.67 kg/m2  Physical Exam  Nursing note and vitals reviewed. Constitutional: He is oriented to person, place, and time. He appears well-developed and well-nourished.  Very thin.  HENT:  Head: Normocephalic.   Nose: Nose normal.  Mouth/Throat: Oropharynx is clear and moist.  Eyes: Conjunctivae are normal. Pupils are equal, round, and reactive to light.  Neck: Normal range of motion. Neck supple. No JVD present.  Cardiovascular: Normal rate, regular rhythm, S1 normal, S2 normal and intact distal pulses.  Exam reveals no friction rub.   Murmur heard.  Crescendo systolic murmur is present with a grade of 2/6  Pulmonary/Chest: Effort normal and breath sounds normal. No respiratory distress. He has no wheezes. He has no rales. He exhibits no tenderness.  Abdominal: Soft. Bowel sounds are normal. He exhibits no distension. There is no tenderness.  Musculoskeletal: Normal range of motion. He exhibits no edema and no tenderness.  Lymphadenopathy:    He has no cervical adenopathy.  Neurological: He is alert and oriented to person, place, and time. Coordination normal.  Skin: Skin is warm and dry. No rash noted. No erythema.  Psychiatric: He has a normal mood and affect. His behavior is normal. Judgment and thought content normal.      Assessment and Plan

## 2012-09-12 NOTE — Assessment & Plan Note (Signed)
Long history of smoking, likely underlying severe coronary artery disease. Not a candidate for cardiac catheterization given severe renal dysfunction. Medical management recommended. Currently with no chest pain. We'll continue him on his current medications.

## 2012-09-12 NOTE — Patient Instructions (Addendum)
You are doing well. No medication changes were made.  Watch the weight, take extra lasix in the afternoon for any weight gain  Please call us if you have new issues that need to be addressed before your next appt.  Your physician wants you to follow-up in: 6 months.  You will receive a reminder letter in the mail two months in advance. If you don't receive a letter, please call our office to schedule the follow-up appointment.

## 2012-09-12 NOTE — Assessment & Plan Note (Addendum)
Recurrent admissions for CHF. Medication noncompliance with dietary noncompliance. Much of today's visit was spent on dietary teaching, heart failure management. He will take extra Lasix for any weight gain of 3 pounds.

## 2012-09-12 NOTE — Assessment & Plan Note (Signed)
Continue on his statin

## 2012-12-08 ENCOUNTER — Emergency Department: Payer: Self-pay | Admitting: Emergency Medicine

## 2012-12-08 LAB — URINALYSIS, COMPLETE
Bacteria: NONE SEEN
Bilirubin,UR: NEGATIVE
Glucose,UR: NEGATIVE mg/dL (ref 0–75)
Hyaline Cast: 1
Leukocyte Esterase: NEGATIVE
Nitrite: NEGATIVE
Protein: 100
RBC,UR: 6 /HPF (ref 0–5)

## 2012-12-08 LAB — CBC
MCV: 83 fL (ref 80–100)
Platelet: 210 10*3/uL (ref 150–440)
RDW: 16.8 % — ABNORMAL HIGH (ref 11.5–14.5)
WBC: 4.8 10*3/uL (ref 3.8–10.6)

## 2012-12-08 LAB — COMPREHENSIVE METABOLIC PANEL
Albumin: 3 g/dL — ABNORMAL LOW (ref 3.4–5.0)
Anion Gap: 7 (ref 7–16)
BUN: 48 mg/dL — ABNORMAL HIGH (ref 7–18)
Bilirubin,Total: 0.3 mg/dL (ref 0.2–1.0)
Calcium, Total: 9 mg/dL (ref 8.5–10.1)
Chloride: 105 mmol/L (ref 98–107)
Co2: 29 mmol/L (ref 21–32)
EGFR (African American): 23 — ABNORMAL LOW
Glucose: 89 mg/dL (ref 65–99)
Osmolality: 293 (ref 275–301)
Potassium: 4.4 mmol/L (ref 3.5–5.1)
SGPT (ALT): 16 U/L (ref 12–78)
Sodium: 141 mmol/L (ref 136–145)
Total Protein: 6.7 g/dL (ref 6.4–8.2)

## 2012-12-08 LAB — TROPONIN I: Troponin-I: 0.04 ng/mL

## 2013-03-21 ENCOUNTER — Other Ambulatory Visit: Payer: Self-pay | Admitting: *Deleted

## 2013-03-25 ENCOUNTER — Emergency Department: Payer: Self-pay | Admitting: Emergency Medicine

## 2013-03-26 ENCOUNTER — Other Ambulatory Visit: Payer: Self-pay

## 2013-03-26 MED ORDER — FUROSEMIDE 40 MG PO TABS: 40.0000 mg | ORAL_TABLET | ORAL | Status: DC

## 2013-03-26 MED ORDER — FUROSEMIDE 40 MG PO TABS: 40.0000 mg | ORAL_TABLET | Freq: Every day | ORAL | Status: AC

## 2013-03-26 NOTE — Telephone Encounter (Signed)
Refill sent for Lasix 40 mg take one tablet daily.

## 2013-05-13 ENCOUNTER — Emergency Department: Payer: Self-pay | Admitting: Emergency Medicine

## 2013-05-26 LAB — COMPREHENSIVE METABOLIC PANEL
Alkaline Phosphatase: 107 U/L (ref 50–136)
Anion Gap: 6 — ABNORMAL LOW (ref 7–16)
BUN: 53 mg/dL — ABNORMAL HIGH (ref 7–18)
Bilirubin,Total: 0.5 mg/dL (ref 0.2–1.0)
Calcium, Total: 8.9 mg/dL (ref 8.5–10.1)
Co2: 26 mmol/L (ref 21–32)
Creatinine: 3.11 mg/dL — ABNORMAL HIGH (ref 0.60–1.30)
EGFR (African American): 22 — ABNORMAL LOW
EGFR (Non-African Amer.): 19 — ABNORMAL LOW
Glucose: 95 mg/dL (ref 65–99)
Osmolality: 297 (ref 275–301)
SGPT (ALT): 29 U/L (ref 12–78)
Total Protein: 6.4 g/dL (ref 6.4–8.2)

## 2013-05-26 LAB — CBC
HCT: 34.4 % — ABNORMAL LOW (ref 40.0–52.0)
HGB: 11.2 g/dL — ABNORMAL LOW (ref 13.0–18.0)
MCH: 27.4 pg (ref 26.0–34.0)
MCHC: 32.4 g/dL (ref 32.0–36.0)
MCV: 85 fL (ref 80–100)
Platelet: 73 10*3/uL — ABNORMAL LOW (ref 150–440)

## 2013-05-26 LAB — TROPONIN I: Troponin-I: 0.05 ng/mL

## 2013-05-26 LAB — MAGNESIUM: Magnesium: 2 mg/dL

## 2013-05-27 ENCOUNTER — Inpatient Hospital Stay: Payer: Self-pay | Admitting: Internal Medicine

## 2013-05-27 LAB — URINALYSIS, COMPLETE
Bacteria: NONE SEEN
Bilirubin,UR: NEGATIVE
Blood: NEGATIVE
Ph: 6 (ref 4.5–8.0)
Protein: 100
Specific Gravity: 1.009 (ref 1.003–1.030)
Squamous Epithelial: NONE SEEN
WBC UR: NONE SEEN /HPF (ref 0–5)

## 2013-05-28 LAB — BASIC METABOLIC PANEL
Calcium, Total: 8.2 mg/dL — ABNORMAL LOW (ref 8.5–10.1)
Chloride: 107 mmol/L (ref 98–107)
Co2: 27 mmol/L (ref 21–32)
Creatinine: 3.17 mg/dL — ABNORMAL HIGH (ref 0.60–1.30)
EGFR (African American): 22 — ABNORMAL LOW
Glucose: 186 mg/dL — ABNORMAL HIGH (ref 65–99)

## 2013-06-08 ENCOUNTER — Emergency Department: Payer: Self-pay | Admitting: Emergency Medicine

## 2013-06-08 LAB — CBC
HGB: 10.8 g/dL — ABNORMAL LOW (ref 13.0–18.0)
MCH: 27.9 pg (ref 26.0–34.0)
MCV: 85 fL (ref 80–100)
RBC: 3.86 10*6/uL — ABNORMAL LOW (ref 4.40–5.90)
WBC: 3.2 10*3/uL — ABNORMAL LOW (ref 3.8–10.6)

## 2013-06-08 LAB — PRO B NATRIURETIC PEPTIDE: B-Type Natriuretic Peptide: 28871 pg/mL — ABNORMAL HIGH (ref 0–125)

## 2013-06-08 LAB — COMPREHENSIVE METABOLIC PANEL
Albumin: 2.8 g/dL — ABNORMAL LOW (ref 3.4–5.0)
Anion Gap: 6 — ABNORMAL LOW (ref 7–16)
BUN: 52 mg/dL — ABNORMAL HIGH (ref 7–18)
Bilirubin,Total: 0.2 mg/dL (ref 0.2–1.0)
Chloride: 107 mmol/L (ref 98–107)
Co2: 28 mmol/L (ref 21–32)
EGFR (Non-African Amer.): 20 — ABNORMAL LOW
Glucose: 95 mg/dL (ref 65–99)
Osmolality: 295 (ref 275–301)
SGOT(AST): 16 U/L (ref 15–37)

## 2013-06-08 LAB — CK TOTAL AND CKMB (NOT AT ARMC): CK, Total: 43 U/L (ref 35–232)

## 2013-06-25 ENCOUNTER — Emergency Department: Payer: Self-pay | Admitting: Emergency Medicine

## 2013-07-04 ENCOUNTER — Emergency Department: Payer: Self-pay | Admitting: Emergency Medicine

## 2013-07-04 LAB — CBC WITH DIFFERENTIAL/PLATELET
Basophil %: 0.8 %
Eosinophil #: 0 10*3/uL (ref 0.0–0.7)
Eosinophil %: 0.8 %
HCT: 35.6 % — ABNORMAL LOW (ref 40.0–52.0)
HGB: 11.5 g/dL — ABNORMAL LOW (ref 13.0–18.0)
Lymphocyte #: 0.9 10*3/uL — ABNORMAL LOW (ref 1.0–3.6)
MCH: 28.1 pg (ref 26.0–34.0)
MCHC: 32.3 g/dL (ref 32.0–36.0)
MCV: 87 fL (ref 80–100)
Monocyte #: 0.4 x10 3/mm (ref 0.2–1.0)
Neutrophil %: 68 %
RBC: 4.08 10*6/uL — ABNORMAL LOW (ref 4.40–5.90)
RDW: 17.9 % — ABNORMAL HIGH (ref 11.5–14.5)

## 2013-07-04 LAB — URINALYSIS, COMPLETE
Bacteria: NONE SEEN
Glucose,UR: NEGATIVE mg/dL (ref 0–75)
Ketone: NEGATIVE
Leukocyte Esterase: NEGATIVE
Nitrite: NEGATIVE
Ph: 6 (ref 4.5–8.0)
Protein: 100
RBC,UR: 1 /HPF (ref 0–5)
Specific Gravity: 1.011 (ref 1.003–1.030)
WBC UR: 1 /HPF (ref 0–5)

## 2013-07-04 LAB — LIPASE, BLOOD: Lipase: 255 U/L (ref 73–393)

## 2013-07-05 LAB — COMPREHENSIVE METABOLIC PANEL
Alkaline Phosphatase: 89 U/L
BUN: 79 mg/dL — ABNORMAL HIGH (ref 7–18)
Bilirubin,Total: 0.4 mg/dL (ref 0.2–1.0)
Calcium, Total: 8.2 mg/dL — ABNORMAL LOW (ref 8.5–10.1)
Chloride: 106 mmol/L (ref 98–107)
Co2: 28 mmol/L (ref 21–32)
Potassium: 4.1 mmol/L (ref 3.5–5.1)
SGPT (ALT): 31 U/L (ref 12–78)
Sodium: 139 mmol/L (ref 136–145)

## 2013-07-29 ENCOUNTER — Emergency Department: Payer: Self-pay | Admitting: Emergency Medicine

## 2013-08-02 ENCOUNTER — Ambulatory Visit: Payer: Self-pay | Admitting: Oncology

## 2013-08-03 ENCOUNTER — Emergency Department: Payer: Self-pay | Admitting: Emergency Medicine

## 2013-08-03 LAB — CBC
HCT: 35.1 % — ABNORMAL LOW (ref 40.0–52.0)
HGB: 11.2 g/dL — AB (ref 13.0–18.0)
MCH: 27.5 pg (ref 26.0–34.0)
MCHC: 31.7 g/dL — AB (ref 32.0–36.0)
MCV: 87 fL (ref 80–100)
Platelet: 180 10*3/uL (ref 150–440)
RBC: 4.06 10*6/uL — ABNORMAL LOW (ref 4.40–5.90)
RDW: 17.9 % — ABNORMAL HIGH (ref 11.5–14.5)
WBC: 7.4 10*3/uL (ref 3.8–10.6)

## 2013-08-03 LAB — COMPREHENSIVE METABOLIC PANEL
ALK PHOS: 116 U/L
Albumin: 2.8 g/dL — ABNORMAL LOW (ref 3.4–5.0)
Anion Gap: 7 (ref 7–16)
BUN: 57 mg/dL — AB (ref 7–18)
Bilirubin,Total: 1 mg/dL (ref 0.2–1.0)
CHLORIDE: 109 mmol/L — AB (ref 98–107)
Calcium, Total: 8.9 mg/dL (ref 8.5–10.1)
Co2: 25 mmol/L (ref 21–32)
Creatinine: 3.55 mg/dL — ABNORMAL HIGH (ref 0.60–1.30)
EGFR (African American): 19 — ABNORMAL LOW
EGFR (Non-African Amer.): 16 — ABNORMAL LOW
Glucose: 95 mg/dL (ref 65–99)
Osmolality: 297 (ref 275–301)
Potassium: 4.5 mmol/L (ref 3.5–5.1)
SGOT(AST): 48 U/L — ABNORMAL HIGH (ref 15–37)
SGPT (ALT): 40 U/L (ref 12–78)
Sodium: 141 mmol/L (ref 136–145)
Total Protein: 6.2 g/dL — ABNORMAL LOW (ref 6.4–8.2)

## 2013-08-03 LAB — URINALYSIS, COMPLETE
BILIRUBIN, UR: NEGATIVE
Glucose,UR: NEGATIVE mg/dL (ref 0–75)
KETONE: NEGATIVE
LEUKOCYTE ESTERASE: NEGATIVE
Nitrite: NEGATIVE
Ph: 5 (ref 4.5–8.0)
Protein: >=500
RBC,UR: 144 /HPF (ref 0–5)
SPECIFIC GRAVITY: 1.015 (ref 1.003–1.030)
Squamous Epithelial: 1
WBC UR: 16 /HPF (ref 0–5)

## 2013-08-04 ENCOUNTER — Inpatient Hospital Stay: Payer: Self-pay | Admitting: Internal Medicine

## 2013-08-04 LAB — COMPREHENSIVE METABOLIC PANEL
ALT: 155 U/L — AB (ref 12–78)
Albumin: 2.8 g/dL — ABNORMAL LOW (ref 3.4–5.0)
Alkaline Phosphatase: 109 U/L
Anion Gap: 10 (ref 7–16)
BUN: 82 mg/dL — ABNORMAL HIGH (ref 7–18)
Bilirubin,Total: 0.7 mg/dL (ref 0.2–1.0)
CO2: 23 mmol/L (ref 21–32)
CREATININE: 4.79 mg/dL — AB (ref 0.60–1.30)
Calcium, Total: 8.6 mg/dL (ref 8.5–10.1)
Chloride: 105 mmol/L (ref 98–107)
EGFR (African American): 13 — ABNORMAL LOW
EGFR (Non-African Amer.): 11 — ABNORMAL LOW
Glucose: 93 mg/dL (ref 65–99)
Osmolality: 300 (ref 275–301)
POTASSIUM: 5.4 mmol/L — AB (ref 3.5–5.1)
SGOT(AST): 253 U/L — ABNORMAL HIGH (ref 15–37)
Sodium: 138 mmol/L (ref 136–145)
Total Protein: 6.6 g/dL (ref 6.4–8.2)

## 2013-08-04 LAB — CK-MB
CK-MB: 1 ng/mL (ref 0.5–3.6)
CK-MB: 2.1 ng/mL (ref 0.5–3.6)
CK-MB: 2.7 ng/mL (ref 0.5–3.6)

## 2013-08-04 LAB — TROPONIN I
TROPONIN-I: 0.28 ng/mL — AB
TROPONIN-I: 0.37 ng/mL — AB
Troponin-I: 0.2 ng/mL — ABNORMAL HIGH

## 2013-08-04 LAB — CBC
HCT: 34.8 % — AB (ref 40.0–52.0)
HGB: 11.2 g/dL — AB (ref 13.0–18.0)
MCH: 27.7 pg (ref 26.0–34.0)
MCHC: 32.2 g/dL (ref 32.0–36.0)
MCV: 86 fL (ref 80–100)
PLATELETS: 140 10*3/uL — AB (ref 150–440)
RBC: 4.05 10*6/uL — ABNORMAL LOW (ref 4.40–5.90)
RDW: 18 % — ABNORMAL HIGH (ref 11.5–14.5)
WBC: 8.3 10*3/uL (ref 3.8–10.6)

## 2013-08-04 LAB — CK TOTAL AND CKMB (NOT AT ARMC)
CK, Total: 366 U/L — ABNORMAL HIGH (ref 35–232)
CK-MB: 0.6 ng/mL (ref 0.5–3.6)

## 2013-08-04 LAB — RAPID INFLUENZA A&B ANTIGENS

## 2013-08-05 LAB — COMPREHENSIVE METABOLIC PANEL
ALT: 169 U/L — AB (ref 12–78)
ANION GAP: 9 (ref 7–16)
AST: 227 U/L — AB (ref 15–37)
Albumin: 2.2 g/dL — ABNORMAL LOW (ref 3.4–5.0)
Alkaline Phosphatase: 87 U/L
BUN: 97 mg/dL — ABNORMAL HIGH (ref 7–18)
Bilirubin,Total: 0.5 mg/dL (ref 0.2–1.0)
CO2: 22 mmol/L (ref 21–32)
Calcium, Total: 8 mg/dL — ABNORMAL LOW (ref 8.5–10.1)
Chloride: 106 mmol/L (ref 98–107)
Creatinine: 5.41 mg/dL — ABNORMAL HIGH (ref 0.60–1.30)
EGFR (African American): 11 — ABNORMAL LOW
EGFR (Non-African Amer.): 10 — ABNORMAL LOW
Glucose: 109 mg/dL — ABNORMAL HIGH (ref 65–99)
Osmolality: 305 (ref 275–301)
Potassium: 5.1 mmol/L (ref 3.5–5.1)
Sodium: 137 mmol/L (ref 136–145)
Total Protein: 5.7 g/dL — ABNORMAL LOW (ref 6.4–8.2)

## 2013-08-05 LAB — CBC WITH DIFFERENTIAL/PLATELET
Bands: 53 %
Basophil #: 0.1 10*3/uL (ref 0.0–0.1)
Basophil %: 0.6 %
EOS ABS: 0 10*3/uL (ref 0.0–0.7)
EOS PCT: 0 %
HCT: 33 % — AB (ref 40.0–52.0)
HGB: 10.5 g/dL — AB (ref 13.0–18.0)
Lymphocyte #: 0.7 10*3/uL — ABNORMAL LOW (ref 1.0–3.6)
Lymphocyte %: 4.7 %
Lymphocytes: 2 %
MCH: 27.2 pg (ref 26.0–34.0)
MCHC: 31.8 g/dL — ABNORMAL LOW (ref 32.0–36.0)
MCV: 86 fL (ref 80–100)
MONOS PCT: 4.2 %
Metamyelocyte: 7 %
Monocyte #: 0.6 x10 3/mm (ref 0.2–1.0)
Monocytes: 5 %
NEUTROS ABS: 12.8 10*3/uL — AB (ref 1.4–6.5)
Neutrophil %: 90.5 %
Platelet: 107 10*3/uL — ABNORMAL LOW (ref 150–440)
RBC: 3.86 10*6/uL — ABNORMAL LOW (ref 4.40–5.90)
RDW: 17.6 % — ABNORMAL HIGH (ref 11.5–14.5)
SEGMENTED NEUTROPHILS: 33 %
WBC: 14.1 10*3/uL — ABNORMAL HIGH (ref 3.8–10.6)

## 2013-08-05 LAB — BASIC METABOLIC PANEL
Anion Gap: 10 (ref 7–16)
BUN: 100 mg/dL — ABNORMAL HIGH (ref 7–18)
CALCIUM: 7.8 mg/dL — AB (ref 8.5–10.1)
Chloride: 108 mmol/L — ABNORMAL HIGH (ref 98–107)
Co2: 21 mmol/L (ref 21–32)
Creatinine: 5.55 mg/dL — ABNORMAL HIGH (ref 0.60–1.30)
EGFR (African American): 11 — ABNORMAL LOW
GFR CALC NON AF AMER: 10 — AB
GLUCOSE: 101 mg/dL — AB (ref 65–99)
OSMOLALITY: 309 (ref 275–301)
Potassium: 5.2 mmol/L — ABNORMAL HIGH (ref 3.5–5.1)
SODIUM: 139 mmol/L (ref 136–145)

## 2013-08-05 LAB — URINALYSIS, COMPLETE
BACTERIA: NONE SEEN
Bilirubin,UR: NEGATIVE
Glucose,UR: NEGATIVE mg/dL (ref 0–75)
Granular Cast: 7
Leukocyte Esterase: NEGATIVE
NITRITE: POSITIVE
Ph: 5 (ref 4.5–8.0)
RBC,UR: 15 /HPF (ref 0–5)
SPECIFIC GRAVITY: 1.014 (ref 1.003–1.030)
WBC UR: 4 /HPF (ref 0–5)

## 2013-08-05 LAB — PLATELET COUNT: PLATELETS: 112 10*3/uL — AB (ref 150–440)

## 2013-08-05 LAB — CK-MB: CK-MB: 3.6 ng/mL (ref 0.5–3.6)

## 2013-08-06 LAB — CBC WITH DIFFERENTIAL/PLATELET
Basophil #: 0 10*3/uL (ref 0.0–0.1)
Basophil %: 0.1 %
Eosinophil #: 0.1 10*3/uL (ref 0.0–0.7)
Eosinophil %: 0.8 %
HCT: 32.3 % — ABNORMAL LOW (ref 40.0–52.0)
HGB: 10.3 g/dL — ABNORMAL LOW (ref 13.0–18.0)
Lymphocyte #: 0.3 10*3/uL — ABNORMAL LOW (ref 1.0–3.6)
Lymphocyte %: 2 %
MCH: 27.4 pg (ref 26.0–34.0)
MCHC: 32.1 g/dL (ref 32.0–36.0)
MCV: 85 fL (ref 80–100)
MONO ABS: 0.3 x10 3/mm (ref 0.2–1.0)
Monocyte %: 2.2 %
NEUTROS ABS: 14.9 10*3/uL — AB (ref 1.4–6.5)
Neutrophil %: 94.9 %
PLATELETS: 106 10*3/uL — AB (ref 150–440)
RBC: 3.78 10*6/uL — ABNORMAL LOW (ref 4.40–5.90)
RDW: 17.8 % — ABNORMAL HIGH (ref 11.5–14.5)
WBC: 15.7 10*3/uL — ABNORMAL HIGH (ref 3.8–10.6)

## 2013-08-06 LAB — RENAL FUNCTION PANEL
Albumin: 1.8 g/dL — ABNORMAL LOW (ref 3.4–5.0)
Anion Gap: 10 (ref 7–16)
BUN: 108 mg/dL — AB (ref 7–18)
CALCIUM: 7.8 mg/dL — AB (ref 8.5–10.1)
Chloride: 108 mmol/L — ABNORMAL HIGH (ref 98–107)
Co2: 20 mmol/L — ABNORMAL LOW (ref 21–32)
Creatinine: 5.59 mg/dL — ABNORMAL HIGH (ref 0.60–1.30)
EGFR (Non-African Amer.): 9 — ABNORMAL LOW
GFR CALC AF AMER: 11 — AB
GLUCOSE: 113 mg/dL — AB (ref 65–99)
OSMOLALITY: 311 (ref 275–301)
PHOSPHORUS: 7 mg/dL — AB (ref 2.5–4.9)
POTASSIUM: 4.9 mmol/L (ref 3.5–5.1)
Sodium: 138 mmol/L (ref 136–145)

## 2013-08-07 LAB — BASIC METABOLIC PANEL
Anion Gap: 9 (ref 7–16)
BUN: 121 mg/dL — ABNORMAL HIGH (ref 7–18)
CHLORIDE: 107 mmol/L (ref 98–107)
CO2: 21 mmol/L (ref 21–32)
CREATININE: 5.42 mg/dL — AB (ref 0.60–1.30)
Calcium, Total: 7.8 mg/dL — ABNORMAL LOW (ref 8.5–10.1)
EGFR (African American): 11 — ABNORMAL LOW
GFR CALC NON AF AMER: 10 — AB
GLUCOSE: 128 mg/dL — AB (ref 65–99)
Osmolality: 314 (ref 275–301)
Potassium: 4.7 mmol/L (ref 3.5–5.1)
SODIUM: 137 mmol/L (ref 136–145)

## 2013-08-07 LAB — CBC WITH DIFFERENTIAL/PLATELET
BASOS ABS: 0 10*3/uL (ref 0.0–0.1)
BASOS PCT: 0.2 %
EOS ABS: 0 10*3/uL (ref 0.0–0.7)
Eosinophil %: 0 %
HCT: 31.6 % — ABNORMAL LOW (ref 40.0–52.0)
HGB: 10 g/dL — AB (ref 13.0–18.0)
LYMPHS PCT: 3 %
Lymphocyte #: 0.4 10*3/uL — ABNORMAL LOW (ref 1.0–3.6)
MCH: 27 pg (ref 26.0–34.0)
MCHC: 31.7 g/dL — ABNORMAL LOW (ref 32.0–36.0)
MCV: 85 fL (ref 80–100)
MONO ABS: 0.4 x10 3/mm (ref 0.2–1.0)
MONOS PCT: 2.6 %
Neutrophil #: 13.4 10*3/uL — ABNORMAL HIGH (ref 1.4–6.5)
Neutrophil %: 94.2 %
PLATELETS: 82 10*3/uL — AB (ref 150–440)
RBC: 3.71 10*6/uL — ABNORMAL LOW (ref 4.40–5.90)
RDW: 18.1 % — ABNORMAL HIGH (ref 11.5–14.5)
WBC: 14.3 10*3/uL — ABNORMAL HIGH (ref 3.8–10.6)

## 2013-08-07 LAB — CLOSTRIDIUM DIFFICILE(ARMC)

## 2013-08-07 LAB — MAGNESIUM: MAGNESIUM: 2.4 mg/dL

## 2013-08-08 LAB — CBC WITH DIFFERENTIAL/PLATELET
Basophil #: 0 10*3/uL (ref 0.0–0.1)
Basophil %: 0.1 %
EOS ABS: 0 10*3/uL (ref 0.0–0.7)
Eosinophil %: 0 %
HCT: 32.2 % — ABNORMAL LOW (ref 40.0–52.0)
HGB: 10.3 g/dL — ABNORMAL LOW (ref 13.0–18.0)
Lymphocyte #: 0.4 10*3/uL — ABNORMAL LOW (ref 1.0–3.6)
Lymphocyte %: 4.8 %
MCH: 27.6 pg (ref 26.0–34.0)
MCHC: 32 g/dL (ref 32.0–36.0)
MCV: 86 fL (ref 80–100)
Monocyte #: 0.4 x10 3/mm (ref 0.2–1.0)
Monocyte %: 4 %
NEUTROS ABS: 8.4 10*3/uL — AB (ref 1.4–6.5)
NEUTROS PCT: 91.1 %
Platelet: 93 10*3/uL — ABNORMAL LOW (ref 150–440)
RBC: 3.74 10*6/uL — ABNORMAL LOW (ref 4.40–5.90)
RDW: 18.1 % — AB (ref 11.5–14.5)
WBC: 9.2 10*3/uL (ref 3.8–10.6)

## 2013-08-08 LAB — COMPREHENSIVE METABOLIC PANEL
ALBUMIN: 2 g/dL — AB (ref 3.4–5.0)
ALK PHOS: 103 U/L
Anion Gap: 9 (ref 7–16)
BUN: 118 mg/dL — ABNORMAL HIGH (ref 7–18)
Bilirubin,Total: 0.2 mg/dL (ref 0.2–1.0)
CO2: 20 mmol/L — AB (ref 21–32)
Calcium, Total: 8.3 mg/dL — ABNORMAL LOW (ref 8.5–10.1)
Chloride: 108 mmol/L — ABNORMAL HIGH (ref 98–107)
Creatinine: 5.23 mg/dL — ABNORMAL HIGH (ref 0.60–1.30)
EGFR (African American): 12 — ABNORMAL LOW
EGFR (Non-African Amer.): 10 — ABNORMAL LOW
Glucose: 106 mg/dL — ABNORMAL HIGH (ref 65–99)
Osmolality: 312 (ref 275–301)
Potassium: 4.6 mmol/L (ref 3.5–5.1)
SGOT(AST): 89 U/L — ABNORMAL HIGH (ref 15–37)
SGPT (ALT): 130 U/L — ABNORMAL HIGH (ref 12–78)
Sodium: 137 mmol/L (ref 136–145)
Total Protein: 5.6 g/dL — ABNORMAL LOW (ref 6.4–8.2)

## 2013-08-09 LAB — BASIC METABOLIC PANEL
Anion Gap: 10 (ref 7–16)
BUN: 118 mg/dL — ABNORMAL HIGH (ref 7–18)
CHLORIDE: 110 mmol/L — AB (ref 98–107)
Calcium, Total: 8.1 mg/dL — ABNORMAL LOW (ref 8.5–10.1)
Co2: 21 mmol/L (ref 21–32)
Creatinine: 5.2 mg/dL — ABNORMAL HIGH (ref 0.60–1.30)
EGFR (African American): 12 — ABNORMAL LOW
EGFR (Non-African Amer.): 10 — ABNORMAL LOW
Glucose: 98 mg/dL (ref 65–99)
Osmolality: 319 (ref 275–301)
Potassium: 4.1 mmol/L (ref 3.5–5.1)
SODIUM: 141 mmol/L (ref 136–145)

## 2013-08-09 LAB — CBC WITH DIFFERENTIAL/PLATELET
Comment - H1-Com4: NORMAL
HCT: 31.9 % — ABNORMAL LOW (ref 40.0–52.0)
HGB: 10.4 g/dL — ABNORMAL LOW (ref 13.0–18.0)
Lymphocytes: 13 %
MCH: 27.7 pg (ref 26.0–34.0)
MCHC: 32.6 g/dL (ref 32.0–36.0)
MCV: 85 fL (ref 80–100)
MONOS PCT: 6 %
NRBC/100 WBC: 4 /
PLATELETS: 90 10*3/uL — AB (ref 150–440)
RBC: 3.76 10*6/uL — AB (ref 4.40–5.90)
RDW: 17.9 % — ABNORMAL HIGH (ref 11.5–14.5)
SEGMENTED NEUTROPHILS: 77 %
VARIANT LYMPHOCYTE - H1-RLYMPH: 4 %
WBC: 4.8 10*3/uL (ref 3.8–10.6)

## 2013-08-10 LAB — PHOSPHORUS: Phosphorus: 4.5 mg/dL (ref 2.5–4.9)

## 2013-08-11 LAB — BASIC METABOLIC PANEL
Anion Gap: 9 (ref 7–16)
BUN: 87 mg/dL — AB (ref 7–18)
CO2: 24 mmol/L (ref 21–32)
CREATININE: 4.08 mg/dL — AB (ref 0.60–1.30)
Calcium, Total: 8.2 mg/dL — ABNORMAL LOW (ref 8.5–10.1)
Chloride: 110 mmol/L — ABNORMAL HIGH (ref 98–107)
EGFR (Non-African Amer.): 14 — ABNORMAL LOW
GFR CALC AF AMER: 16 — AB
Glucose: 80 mg/dL (ref 65–99)
Osmolality: 310 (ref 275–301)
Potassium: 4.1 mmol/L (ref 3.5–5.1)
Sodium: 143 mmol/L (ref 136–145)

## 2013-08-11 LAB — PHOSPHORUS: PHOSPHORUS: 2.2 mg/dL — AB (ref 2.5–4.9)

## 2013-08-13 LAB — PHOSPHORUS: Phosphorus: 3 mg/dL (ref 2.5–4.9)

## 2013-08-14 ENCOUNTER — Inpatient Hospital Stay
Admission: AD | Admit: 2013-08-14 | Discharge: 2013-08-17 | Disposition: A | Discharge status: Skilled Nursing Facility | Payer: Self-pay | Source: Ambulatory Visit | Attending: Internal Medicine | Admitting: Internal Medicine

## 2013-08-14 LAB — CBC WITH DIFFERENTIAL/PLATELET
BASOS ABS: 0 10*3/uL (ref 0.0–0.1)
Basophil %: 0.4 %
Eosinophil #: 0.1 10*3/uL (ref 0.0–0.7)
Eosinophil %: 2 %
HCT: 29.3 % — ABNORMAL LOW (ref 40.0–52.0)
HGB: 9.4 g/dL — ABNORMAL LOW (ref 13.0–18.0)
Lymphocyte #: 0.6 10*3/uL — ABNORMAL LOW (ref 1.0–3.6)
Lymphocyte %: 12 %
MCH: 27.3 pg (ref 26.0–34.0)
MCHC: 32.2 g/dL (ref 32.0–36.0)
MCV: 85 fL (ref 80–100)
MONOS PCT: 8.9 %
Monocyte #: 0.4 x10 3/mm (ref 0.2–1.0)
NEUTROS ABS: 3.6 10*3/uL (ref 1.4–6.5)
Neutrophil %: 76.7 %
PLATELETS: 176 10*3/uL (ref 150–440)
RBC: 3.45 10*6/uL — AB (ref 4.40–5.90)
RDW: 17.5 % — ABNORMAL HIGH (ref 11.5–14.5)
WBC: 4.7 10*3/uL (ref 3.8–10.6)

## 2013-08-14 LAB — BASIC METABOLIC PANEL
ANION GAP: 2 — AB (ref 7–16)
BUN: 23 mg/dL — ABNORMAL HIGH (ref 7–18)
CO2: 33 mmol/L — AB (ref 21–32)
CREATININE: 2.12 mg/dL — AB (ref 0.60–1.30)
Calcium, Total: 8.3 mg/dL — ABNORMAL LOW (ref 8.5–10.1)
Chloride: 102 mmol/L (ref 98–107)
EGFR (African American): 35 — ABNORMAL LOW
EGFR (Non-African Amer.): 31 — ABNORMAL LOW
GLUCOSE: 111 mg/dL — AB (ref 65–99)
Osmolality: 278 (ref 275–301)
Potassium: 3.5 mmol/L (ref 3.5–5.1)
Sodium: 137 mmol/L (ref 136–145)

## 2013-08-14 LAB — PHOSPHORUS: Phosphorus: 2.7 mg/dL (ref 2.5–4.9)

## 2013-08-15 ENCOUNTER — Other Ambulatory Visit (HOSPITAL_COMMUNITY): Payer: Self-pay

## 2013-08-15 LAB — HEPATIC FUNCTION PANEL
ALK PHOS: 86 U/L (ref 39–117)
ALT: 30 U/L (ref 0–53)
AST: 19 U/L (ref 0–37)
Albumin: 2.1 g/dL — ABNORMAL LOW (ref 3.5–5.2)
TOTAL PROTEIN: 5.7 g/dL — AB (ref 6.0–8.3)
Total Bilirubin: 0.3 mg/dL (ref 0.3–1.2)

## 2013-08-15 LAB — PROTIME-INR
INR: 1.12 (ref 0.00–1.49)
PROTHROMBIN TIME: 14.2 s (ref 11.6–15.2)

## 2013-08-15 LAB — CBC WITH DIFFERENTIAL/PLATELET
BASOS ABS: 0 10*3/uL (ref 0.0–0.1)
BASOS PCT: 0 % (ref 0–1)
Eosinophils Absolute: 0.1 10*3/uL (ref 0.0–0.7)
Eosinophils Relative: 1 % (ref 0–5)
HCT: 31.3 % — ABNORMAL LOW (ref 39.0–52.0)
HEMOGLOBIN: 9.7 g/dL — AB (ref 13.0–17.0)
LYMPHS PCT: 19 % (ref 12–46)
Lymphs Abs: 0.8 10*3/uL (ref 0.7–4.0)
MCH: 27.2 pg (ref 26.0–34.0)
MCHC: 31 g/dL (ref 30.0–36.0)
MCV: 87.7 fL (ref 78.0–100.0)
Monocytes Absolute: 0.7 10*3/uL (ref 0.1–1.0)
Monocytes Relative: 15 % — ABNORMAL HIGH (ref 3–12)
NEUTROS ABS: 2.8 10*3/uL (ref 1.7–7.7)
NEUTROS PCT: 65 % (ref 43–77)
Platelets: 193 10*3/uL (ref 150–400)
RBC: 3.57 MIL/uL — ABNORMAL LOW (ref 4.22–5.81)
RDW: 17.2 % — AB (ref 11.5–15.5)
WBC: 4.3 10*3/uL (ref 4.0–10.5)

## 2013-08-15 LAB — BASIC METABOLIC PANEL
BUN: 14 mg/dL (ref 6–23)
CALCIUM: 8.3 mg/dL — AB (ref 8.4–10.5)
CO2: 32 mEq/L (ref 19–32)
Chloride: 101 mEq/L (ref 96–112)
Creatinine, Ser: 2.01 mg/dL — ABNORMAL HIGH (ref 0.50–1.35)
GFR calc non Af Amer: 32 mL/min — ABNORMAL LOW (ref >90)
GFR, EST AFRICAN AMERICAN: 37 mL/min — AB (ref >90)
Glucose, Bld: 89 mg/dL (ref 70–99)
Potassium: 4.1 mEq/L (ref 3.7–5.3)
Sodium: 142 mEq/L (ref 137–147)

## 2013-08-15 LAB — PRO B NATRIURETIC PEPTIDE: Pro B Natriuretic peptide (BNP): 32073 pg/mL — ABNORMAL HIGH (ref 0–125)

## 2013-08-15 LAB — SEDIMENTATION RATE: Sed Rate: 48 mm/hr — ABNORMAL HIGH (ref 0–16)

## 2013-08-15 LAB — PROCALCITONIN: Procalcitonin: 3.37 ng/mL

## 2013-08-15 LAB — MAGNESIUM: Magnesium: 1.9 mg/dL (ref 1.5–2.5)

## 2013-08-15 LAB — PREALBUMIN: PREALBUMIN: 16.3 mg/dL — AB (ref 17.0–34.0)

## 2013-08-15 LAB — PHOSPHORUS: Phosphorus: 2.2 mg/dL — ABNORMAL LOW (ref 2.3–4.6)

## 2013-08-15 LAB — TSH: TSH: 3.647 u[IU]/mL (ref 0.350–4.500)

## 2013-08-17 LAB — BASIC METABOLIC PANEL
BUN: 40 mg/dL — AB (ref 6–23)
CHLORIDE: 101 meq/L (ref 96–112)
CO2: 32 meq/L (ref 19–32)
Calcium: 8.1 mg/dL — ABNORMAL LOW (ref 8.4–10.5)
Creatinine, Ser: 3.36 mg/dL — ABNORMAL HIGH (ref 0.50–1.35)
GFR calc Af Amer: 20 mL/min — ABNORMAL LOW (ref >90)
GFR, EST NON AFRICAN AMERICAN: 17 mL/min — AB (ref >90)
Glucose, Bld: 105 mg/dL — ABNORMAL HIGH (ref 70–99)
POTASSIUM: 3.9 meq/L (ref 3.7–5.3)
SODIUM: 142 meq/L (ref 137–147)

## 2013-08-17 LAB — CBC
HEMATOCRIT: 30.6 % — AB (ref 39.0–52.0)
Hemoglobin: 9.7 g/dL — ABNORMAL LOW (ref 13.0–17.0)
MCH: 27.8 pg (ref 26.0–34.0)
MCHC: 31.7 g/dL (ref 30.0–36.0)
MCV: 87.7 fL (ref 78.0–100.0)
Platelets: 224 10*3/uL (ref 150–400)
RBC: 3.49 MIL/uL — AB (ref 4.22–5.81)
RDW: 17.1 % — ABNORMAL HIGH (ref 11.5–15.5)
WBC: 3 10*3/uL — ABNORMAL LOW (ref 4.0–10.5)

## 2013-08-17 LAB — PHOSPHORUS: PHOSPHORUS: 3.5 mg/dL (ref 2.3–4.6)

## 2013-09-02 ENCOUNTER — Ambulatory Visit: Payer: Self-pay | Admitting: Oncology

## 2013-10-21 ENCOUNTER — Emergency Department: Payer: Self-pay | Admitting: Internal Medicine

## 2013-10-21 LAB — COMPREHENSIVE METABOLIC PANEL
ALK PHOS: 83 U/L
Albumin: 2.6 g/dL — ABNORMAL LOW (ref 3.4–5.0)
Anion Gap: 6 — ABNORMAL LOW (ref 7–16)
BUN: 63 mg/dL — ABNORMAL HIGH (ref 7–18)
Bilirubin,Total: 0.5 mg/dL (ref 0.2–1.0)
CALCIUM: 8.8 mg/dL (ref 8.5–10.1)
CHLORIDE: 103 mmol/L (ref 98–107)
CO2: 29 mmol/L (ref 21–32)
Creatinine: 3.87 mg/dL — ABNORMAL HIGH (ref 0.60–1.30)
EGFR (African American): 17 — ABNORMAL LOW
EGFR (Non-African Amer.): 15 — ABNORMAL LOW
GLUCOSE: 88 mg/dL (ref 65–99)
Osmolality: 293 (ref 275–301)
Potassium: 4.2 mmol/L (ref 3.5–5.1)
SGOT(AST): 20 U/L (ref 15–37)
SGPT (ALT): 19 U/L (ref 12–78)
Sodium: 138 mmol/L (ref 136–145)
TOTAL PROTEIN: 7.1 g/dL (ref 6.4–8.2)

## 2013-10-21 LAB — CBC
HCT: 28.3 % — ABNORMAL LOW (ref 40.0–52.0)
HGB: 9 g/dL — AB (ref 13.0–18.0)
MCH: 28.4 pg (ref 26.0–34.0)
MCHC: 31.7 g/dL — AB (ref 32.0–36.0)
MCV: 90 fL (ref 80–100)
PLATELETS: 184 10*3/uL (ref 150–440)
RBC: 3.15 10*6/uL — ABNORMAL LOW (ref 4.40–5.90)
RDW: 21.1 % — ABNORMAL HIGH (ref 11.5–14.5)
WBC: 3.8 10*3/uL (ref 3.8–10.6)

## 2013-10-21 LAB — PRO B NATRIURETIC PEPTIDE: B-TYPE NATIURETIC PEPTID: 63453 pg/mL — AB (ref 0–125)

## 2013-10-21 LAB — TROPONIN I: Troponin-I: 0.03 ng/mL

## 2013-10-31 ENCOUNTER — Ambulatory Visit: Payer: Self-pay | Admitting: Internal Medicine

## 2013-11-25 ENCOUNTER — Inpatient Hospital Stay: Payer: Self-pay | Admitting: Internal Medicine

## 2013-11-25 LAB — COMPREHENSIVE METABOLIC PANEL
ALT: 16 U/L (ref 12–78)
ANION GAP: 5 — AB (ref 7–16)
Albumin: 2.6 g/dL — ABNORMAL LOW (ref 3.4–5.0)
Alkaline Phosphatase: 89 U/L
BUN: 65 mg/dL — AB (ref 7–18)
Bilirubin,Total: 0.4 mg/dL (ref 0.2–1.0)
CHLORIDE: 99 mmol/L (ref 98–107)
CO2: 37 mmol/L — AB (ref 21–32)
Calcium, Total: 8.8 mg/dL (ref 8.5–10.1)
Creatinine: 3.58 mg/dL — ABNORMAL HIGH (ref 0.60–1.30)
EGFR (African American): 19 — ABNORMAL LOW
EGFR (Non-African Amer.): 16 — ABNORMAL LOW
Glucose: 90 mg/dL (ref 65–99)
OSMOLALITY: 299 (ref 275–301)
Potassium: 3.8 mmol/L (ref 3.5–5.1)
SGOT(AST): 22 U/L (ref 15–37)
Sodium: 141 mmol/L (ref 136–145)
Total Protein: 7.4 g/dL (ref 6.4–8.2)

## 2013-11-25 LAB — CBC WITH DIFFERENTIAL/PLATELET
Basophil #: 0.2 10*3/uL — ABNORMAL HIGH (ref 0.0–0.1)
Basophil %: 3.4 %
EOS ABS: 0.2 10*3/uL (ref 0.0–0.7)
EOS PCT: 3.4 %
HCT: 29.3 % — ABNORMAL LOW (ref 40.0–52.0)
HGB: 9.1 g/dL — AB (ref 13.0–18.0)
LYMPHS PCT: 24.7 %
Lymphocyte #: 1.1 10*3/uL (ref 1.0–3.6)
MCH: 27.4 pg (ref 26.0–34.0)
MCHC: 31 g/dL — ABNORMAL LOW (ref 32.0–36.0)
MCV: 88 fL (ref 80–100)
Monocyte #: 0.6 x10 3/mm (ref 0.2–1.0)
Monocyte %: 13.7 %
NEUTROS ABS: 2.4 10*3/uL (ref 1.4–6.5)
NEUTROS PCT: 54.8 %
Platelet: 221 10*3/uL (ref 150–440)
RBC: 3.31 10*6/uL — AB (ref 4.40–5.90)
RDW: 18.2 % — ABNORMAL HIGH (ref 11.5–14.5)
WBC: 4.4 10*3/uL (ref 3.8–10.6)

## 2013-11-25 LAB — PRO B NATRIURETIC PEPTIDE: B-Type Natriuretic Peptide: 46672 pg/mL — ABNORMAL HIGH (ref 0–125)

## 2013-11-25 LAB — TROPONIN I
TROPONIN-I: 0.11 ng/mL — AB
Troponin-I: 0.07 ng/mL — ABNORMAL HIGH
Troponin-I: 0.08 ng/mL — ABNORMAL HIGH

## 2013-11-26 LAB — BASIC METABOLIC PANEL
Anion Gap: 5 — ABNORMAL LOW (ref 7–16)
BUN: 66 mg/dL — AB (ref 7–18)
CALCIUM: 8.7 mg/dL (ref 8.5–10.1)
Chloride: 100 mmol/L (ref 98–107)
Co2: 36 mmol/L — ABNORMAL HIGH (ref 21–32)
Creatinine: 3.49 mg/dL — ABNORMAL HIGH (ref 0.60–1.30)
EGFR (African American): 19 — ABNORMAL LOW
EGFR (Non-African Amer.): 17 — ABNORMAL LOW
GLUCOSE: 82 mg/dL (ref 65–99)
Osmolality: 299 (ref 275–301)
Potassium: 4.7 mmol/L (ref 3.5–5.1)
Sodium: 141 mmol/L (ref 136–145)

## 2013-11-26 LAB — FERRITIN: Ferritin (ARMC): 114 ng/mL (ref 8–388)

## 2013-11-26 LAB — PHOSPHORUS: Phosphorus: 4.6 mg/dL (ref 2.5–4.9)

## 2013-11-27 ENCOUNTER — Ambulatory Visit: Payer: Medicare Other | Admitting: Cardiovascular Disease

## 2013-11-27 LAB — CLOSTRIDIUM DIFFICILE(ARMC)

## 2013-11-27 LAB — BASIC METABOLIC PANEL
Anion Gap: 8 (ref 7–16)
BUN: 67 mg/dL — ABNORMAL HIGH (ref 7–18)
CALCIUM: 9.2 mg/dL (ref 8.5–10.1)
CO2: 35 mmol/L — AB (ref 21–32)
CREATININE: 4.03 mg/dL — AB (ref 0.60–1.30)
Chloride: 98 mmol/L (ref 98–107)
EGFR (African American): 16 — ABNORMAL LOW
EGFR (Non-African Amer.): 14 — ABNORMAL LOW
Glucose: 91 mg/dL (ref 65–99)
OSMOLALITY: 300 (ref 275–301)
POTASSIUM: 4.2 mmol/L (ref 3.5–5.1)
SODIUM: 141 mmol/L (ref 136–145)

## 2013-11-30 ENCOUNTER — Ambulatory Visit: Payer: Self-pay | Admitting: Internal Medicine

## 2013-12-06 ENCOUNTER — Encounter: Payer: Self-pay | Admitting: *Deleted

## 2013-12-06 ENCOUNTER — Ambulatory Visit: Payer: Medicare Other | Admitting: Cardiovascular Disease

## 2013-12-15 ENCOUNTER — Emergency Department: Payer: Self-pay | Admitting: Emergency Medicine

## 2013-12-15 LAB — CBC
HCT: 28.2 % — ABNORMAL LOW (ref 40.0–52.0)
HGB: 8.8 g/dL — AB (ref 13.0–18.0)
MCH: 26.8 pg (ref 26.0–34.0)
MCHC: 31.3 g/dL — ABNORMAL LOW (ref 32.0–36.0)
MCV: 86 fL (ref 80–100)
Platelet: 210 10*3/uL (ref 150–440)
RBC: 3.29 10*6/uL — ABNORMAL LOW (ref 4.40–5.90)
RDW: 17.6 % — AB (ref 11.5–14.5)
WBC: 3.6 10*3/uL — ABNORMAL LOW (ref 3.8–10.6)

## 2013-12-15 LAB — TROPONIN I: Troponin-I: 0.03 ng/mL

## 2013-12-15 LAB — BASIC METABOLIC PANEL
Anion Gap: 7 (ref 7–16)
BUN: 70 mg/dL — AB (ref 7–18)
CHLORIDE: 103 mmol/L (ref 98–107)
CREATININE: 4.17 mg/dL — AB (ref 0.60–1.30)
Calcium, Total: 8.5 mg/dL (ref 8.5–10.1)
Co2: 31 mmol/L (ref 21–32)
EGFR (Non-African Amer.): 13 — ABNORMAL LOW
GFR CALC AF AMER: 16 — AB
Glucose: 96 mg/dL (ref 65–99)
Osmolality: 302 (ref 275–301)
Potassium: 4.5 mmol/L (ref 3.5–5.1)
Sodium: 141 mmol/L (ref 136–145)

## 2013-12-29 ENCOUNTER — Emergency Department: Payer: Self-pay | Admitting: Emergency Medicine

## 2013-12-29 LAB — URINALYSIS, COMPLETE
BACTERIA: NONE SEEN
BILIRUBIN, UR: NEGATIVE
Blood: NEGATIVE
Glucose,UR: NEGATIVE mg/dL (ref 0–75)
KETONE: NEGATIVE
Leukocyte Esterase: NEGATIVE
NITRITE: NEGATIVE
PH: 6 (ref 4.5–8.0)
Specific Gravity: 1.008 (ref 1.003–1.030)
Squamous Epithelial: NONE SEEN

## 2013-12-29 LAB — CBC
HCT: 28.2 % — ABNORMAL LOW (ref 40.0–52.0)
HGB: 8.6 g/dL — ABNORMAL LOW (ref 13.0–18.0)
MCH: 25.9 pg — AB (ref 26.0–34.0)
MCHC: 30.6 g/dL — AB (ref 32.0–36.0)
MCV: 85 fL (ref 80–100)
Platelet: 189 10*3/uL (ref 150–440)
RBC: 3.33 10*6/uL — ABNORMAL LOW (ref 4.40–5.90)
RDW: 18 % — AB (ref 11.5–14.5)
WBC: 4.3 10*3/uL (ref 3.8–10.6)

## 2013-12-29 LAB — COMPREHENSIVE METABOLIC PANEL
AST: 10 U/L — AB (ref 15–37)
Albumin: 2.9 g/dL — ABNORMAL LOW (ref 3.4–5.0)
Alkaline Phosphatase: 77 U/L
Anion Gap: 8 (ref 7–16)
BILIRUBIN TOTAL: 0.6 mg/dL (ref 0.2–1.0)
BUN: 67 mg/dL — ABNORMAL HIGH (ref 7–18)
CHLORIDE: 99 mmol/L (ref 98–107)
CO2: 30 mmol/L (ref 21–32)
Calcium, Total: 9.1 mg/dL (ref 8.5–10.1)
Creatinine: 3.98 mg/dL — ABNORMAL HIGH (ref 0.60–1.30)
GFR CALC AF AMER: 16 — AB
GFR CALC NON AF AMER: 14 — AB
GLUCOSE: 101 mg/dL — AB (ref 65–99)
Osmolality: 293 (ref 275–301)
Potassium: 4 mmol/L (ref 3.5–5.1)
SGPT (ALT): 13 U/L (ref 12–78)
Sodium: 137 mmol/L (ref 136–145)
TOTAL PROTEIN: 7.2 g/dL (ref 6.4–8.2)

## 2013-12-31 LAB — URINE CULTURE

## 2014-01-06 ENCOUNTER — Emergency Department: Payer: Self-pay | Admitting: Emergency Medicine

## 2014-01-06 LAB — CBC
HCT: 29.1 % — ABNORMAL LOW (ref 40.0–52.0)
HGB: 8.9 g/dL — ABNORMAL LOW (ref 13.0–18.0)
MCH: 25.5 pg — AB (ref 26.0–34.0)
MCHC: 30.5 g/dL — AB (ref 32.0–36.0)
MCV: 84 fL (ref 80–100)
PLATELETS: 213 10*3/uL (ref 150–440)
RBC: 3.48 10*6/uL — AB (ref 4.40–5.90)
RDW: 18.1 % — ABNORMAL HIGH (ref 11.5–14.5)
WBC: 4.3 10*3/uL (ref 3.8–10.6)

## 2014-01-06 LAB — URINALYSIS, COMPLETE
BLOOD: NEGATIVE
Bilirubin,UR: NEGATIVE
GLUCOSE, UR: NEGATIVE mg/dL (ref 0–75)
KETONE: NEGATIVE
Nitrite: NEGATIVE
PH: 5 (ref 4.5–8.0)
Protein: >=500
RBC,UR: 13 /HPF (ref 0–5)
SPECIFIC GRAVITY: 1.014 (ref 1.003–1.030)
WBC UR: 56 /HPF (ref 0–5)

## 2014-01-06 LAB — COMPREHENSIVE METABOLIC PANEL
ALBUMIN: 2.8 g/dL — AB (ref 3.4–5.0)
AST: 25 U/L (ref 15–37)
Alkaline Phosphatase: 74 U/L
Anion Gap: 10 (ref 7–16)
BUN: 69 mg/dL — AB (ref 7–18)
Bilirubin,Total: 0.8 mg/dL (ref 0.2–1.0)
CHLORIDE: 103 mmol/L (ref 98–107)
CREATININE: 4.3 mg/dL — AB (ref 0.60–1.30)
Calcium, Total: 8.9 mg/dL (ref 8.5–10.1)
Co2: 27 mmol/L (ref 21–32)
EGFR (African American): 15 — ABNORMAL LOW
GFR CALC NON AF AMER: 13 — AB
Glucose: 85 mg/dL (ref 65–99)
Osmolality: 299 (ref 275–301)
Potassium: 4.4 mmol/L (ref 3.5–5.1)
SGPT (ALT): 14 U/L (ref 12–78)
SODIUM: 140 mmol/L (ref 136–145)
TOTAL PROTEIN: 7.2 g/dL (ref 6.4–8.2)

## 2014-01-06 LAB — TROPONIN I: TROPONIN-I: 0.04 ng/mL

## 2014-02-14 ENCOUNTER — Emergency Department: Payer: Self-pay | Admitting: Emergency Medicine

## 2014-02-14 LAB — COMPREHENSIVE METABOLIC PANEL
ALK PHOS: 90 U/L
Albumin: 2.7 g/dL — ABNORMAL LOW (ref 3.4–5.0)
Anion Gap: 6 — ABNORMAL LOW (ref 7–16)
BILIRUBIN TOTAL: 0.6 mg/dL (ref 0.2–1.0)
BUN: 73 mg/dL — ABNORMAL HIGH (ref 7–18)
CALCIUM: 8.7 mg/dL (ref 8.5–10.1)
CO2: 28 mmol/L (ref 21–32)
Chloride: 105 mmol/L (ref 98–107)
Creatinine: 4.48 mg/dL — ABNORMAL HIGH (ref 0.60–1.30)
EGFR (African American): 14 — ABNORMAL LOW
GFR CALC NON AF AMER: 12 — AB
Glucose: 111 mg/dL — ABNORMAL HIGH (ref 65–99)
Osmolality: 300 (ref 275–301)
Potassium: 3.9 mmol/L (ref 3.5–5.1)
SGOT(AST): 16 U/L (ref 15–37)
SGPT (ALT): 15 U/L (ref 12–78)
SODIUM: 139 mmol/L (ref 136–145)
Total Protein: 7.3 g/dL (ref 6.4–8.2)

## 2014-02-14 LAB — CBC WITH DIFFERENTIAL/PLATELET
Basophil #: 0.1 10*3/uL (ref 0.0–0.1)
Basophil %: 1.3 %
EOS ABS: 0.2 10*3/uL (ref 0.0–0.7)
Eosinophil %: 5.4 %
HCT: 29.5 % — ABNORMAL LOW (ref 40.0–52.0)
HGB: 9 g/dL — ABNORMAL LOW (ref 13.0–18.0)
LYMPHS ABS: 1.2 10*3/uL (ref 1.0–3.6)
LYMPHS PCT: 26.2 %
MCH: 24.6 pg — ABNORMAL LOW (ref 26.0–34.0)
MCHC: 30.4 g/dL — ABNORMAL LOW (ref 32.0–36.0)
MCV: 81 fL (ref 80–100)
Monocyte #: 0.7 x10 3/mm (ref 0.2–1.0)
Monocyte %: 16.2 %
Neutrophil #: 2.3 10*3/uL (ref 1.4–6.5)
Neutrophil %: 50.9 %
PLATELETS: 225 10*3/uL (ref 150–440)
RBC: 3.65 10*6/uL — AB (ref 4.40–5.90)
RDW: 19.4 % — ABNORMAL HIGH (ref 11.5–14.5)
WBC: 4.4 10*3/uL (ref 3.8–10.6)

## 2014-02-21 ENCOUNTER — Emergency Department: Payer: Self-pay | Admitting: Emergency Medicine

## 2014-02-21 LAB — BASIC METABOLIC PANEL
ANION GAP: 8 (ref 7–16)
BUN: 73 mg/dL — ABNORMAL HIGH (ref 7–18)
CALCIUM: 8.3 mg/dL — AB (ref 8.5–10.1)
Chloride: 106 mmol/L (ref 98–107)
Co2: 23 mmol/L (ref 21–32)
Creatinine: 4.47 mg/dL — ABNORMAL HIGH (ref 0.60–1.30)
EGFR (African American): 14 — ABNORMAL LOW
GFR CALC NON AF AMER: 12 — AB
Glucose: 118 mg/dL — ABNORMAL HIGH (ref 65–99)
Osmolality: 296 (ref 275–301)
Potassium: 4.3 mmol/L (ref 3.5–5.1)
Sodium: 137 mmol/L (ref 136–145)

## 2014-02-21 LAB — CBC
HCT: 31.3 % — AB (ref 40.0–52.0)
HGB: 9.4 g/dL — ABNORMAL LOW (ref 13.0–18.0)
MCH: 24.5 pg — ABNORMAL LOW (ref 26.0–34.0)
MCHC: 30 g/dL — ABNORMAL LOW (ref 32.0–36.0)
MCV: 82 fL (ref 80–100)
PLATELETS: 227 10*3/uL (ref 150–440)
RBC: 3.84 10*6/uL — ABNORMAL LOW (ref 4.40–5.90)
RDW: 19.9 % — ABNORMAL HIGH (ref 11.5–14.5)
WBC: 4.3 10*3/uL (ref 3.8–10.6)

## 2014-02-21 LAB — TROPONIN I
TROPONIN-I: 0.03 ng/mL
Troponin-I: 0.04 ng/mL

## 2014-02-21 LAB — PRO B NATRIURETIC PEPTIDE: B-Type Natriuretic Peptide: 56334 pg/mL — ABNORMAL HIGH (ref 0–125)

## 2014-02-28 ENCOUNTER — Emergency Department: Payer: Self-pay

## 2014-02-28 LAB — CBC
HCT: 28.9 % — ABNORMAL LOW (ref 40.0–52.0)
HGB: 8.7 g/dL — AB (ref 13.0–18.0)
MCH: 24.2 pg — AB (ref 26.0–34.0)
MCHC: 30.2 g/dL — AB (ref 32.0–36.0)
MCV: 80 fL (ref 80–100)
PLATELETS: 191 10*3/uL (ref 150–440)
RBC: 3.61 10*6/uL — AB (ref 4.40–5.90)
RDW: 19.8 % — AB (ref 11.5–14.5)
WBC: 3.8 10*3/uL (ref 3.8–10.6)

## 2014-02-28 LAB — BASIC METABOLIC PANEL
Anion Gap: 10 (ref 7–16)
BUN: 64 mg/dL — AB (ref 7–18)
CALCIUM: 8.4 mg/dL — AB (ref 8.5–10.1)
CO2: 26 mmol/L (ref 21–32)
Chloride: 107 mmol/L (ref 98–107)
Creatinine: 4.38 mg/dL — ABNORMAL HIGH (ref 0.60–1.30)
EGFR (African American): 15 — ABNORMAL LOW
EGFR (Non-African Amer.): 13 — ABNORMAL LOW
Glucose: 133 mg/dL — ABNORMAL HIGH (ref 65–99)
Osmolality: 305 (ref 275–301)
Potassium: 3.1 mmol/L — ABNORMAL LOW (ref 3.5–5.1)
Sodium: 143 mmol/L (ref 136–145)

## 2014-02-28 LAB — TROPONIN I: TROPONIN-I: 0.03 ng/mL

## 2014-02-28 LAB — PRO B NATRIURETIC PEPTIDE: B-TYPE NATIURETIC PEPTID: 50965 pg/mL — AB (ref 0–125)

## 2014-05-02 ENCOUNTER — Ambulatory Visit: Payer: Self-pay | Admitting: Internal Medicine

## 2014-05-02 ENCOUNTER — Emergency Department: Payer: Self-pay | Admitting: Emergency Medicine

## 2014-05-02 LAB — URINALYSIS, COMPLETE
BILIRUBIN, UR: NEGATIVE
Bacteria: NONE SEEN
Blood: NEGATIVE
Glucose,UR: NEGATIVE mg/dL (ref 0–75)
Hyaline Cast: 2
KETONE: NEGATIVE
Leukocyte Esterase: NEGATIVE
Nitrite: NEGATIVE
PH: 5 (ref 4.5–8.0)
Protein: 100
Specific Gravity: 1.011 (ref 1.003–1.030)
Squamous Epithelial: <1

## 2014-05-02 LAB — COMPREHENSIVE METABOLIC PANEL
AST: 15 U/L (ref 15–37)
Albumin: 2.7 g/dL — ABNORMAL LOW (ref 3.4–5.0)
Alkaline Phosphatase: 104 U/L
Anion Gap: 5 — ABNORMAL LOW (ref 7–16)
BILIRUBIN TOTAL: 0.6 mg/dL (ref 0.2–1.0)
BUN: 68 mg/dL — AB (ref 7–18)
CREATININE: 3.9 mg/dL — AB (ref 0.60–1.30)
Calcium, Total: 8.9 mg/dL (ref 8.5–10.1)
Chloride: 105 mmol/L (ref 98–107)
Co2: 31 mmol/L (ref 21–32)
EGFR (African American): 20 — ABNORMAL LOW
EGFR (Non-African Amer.): 16 — ABNORMAL LOW
GLUCOSE: 91 mg/dL (ref 65–99)
Osmolality: 301 (ref 275–301)
Potassium: 3.3 mmol/L — ABNORMAL LOW (ref 3.5–5.1)
SGPT (ALT): 12 U/L — ABNORMAL LOW
Sodium: 141 mmol/L (ref 136–145)
TOTAL PROTEIN: 7.2 g/dL (ref 6.4–8.2)

## 2014-05-02 LAB — CBC
HCT: 31.9 % — AB (ref 40.0–52.0)
HGB: 9.7 g/dL — AB (ref 13.0–18.0)
MCH: 25.3 pg — AB (ref 26.0–34.0)
MCHC: 30.5 g/dL — ABNORMAL LOW (ref 32.0–36.0)
MCV: 83 fL (ref 80–100)
Platelet: 232 10*3/uL (ref 150–440)
RBC: 3.85 10*6/uL — ABNORMAL LOW (ref 4.40–5.90)
RDW: 19.2 % — ABNORMAL HIGH (ref 11.5–14.5)
WBC: 4.9 10*3/uL (ref 3.8–10.6)

## 2014-05-02 LAB — APTT: Activated PTT: 31.9 secs (ref 23.6–35.9)

## 2014-05-02 LAB — PROTIME-INR
INR: 1.1
Prothrombin Time: 14.2 secs (ref 11.5–14.7)

## 2014-05-02 LAB — CK TOTAL AND CKMB (NOT AT ARMC)
CK, Total: 46 U/L
CK-MB: 1.3 ng/mL (ref 0.5–3.6)

## 2014-05-02 LAB — PRO B NATRIURETIC PEPTIDE: B-Type Natriuretic Peptide: 57546 pg/mL — ABNORMAL HIGH (ref 0–125)

## 2014-05-02 LAB — TROPONIN I: Troponin-I: 0.04 ng/mL

## 2014-05-05 ENCOUNTER — Inpatient Hospital Stay: Payer: Self-pay | Admitting: Internal Medicine

## 2014-05-05 LAB — COMPREHENSIVE METABOLIC PANEL
ALK PHOS: 111 U/L
ANION GAP: 9 (ref 7–16)
AST: 25 U/L (ref 15–37)
Albumin: 2.8 g/dL — ABNORMAL LOW (ref 3.4–5.0)
BUN: 78 mg/dL — ABNORMAL HIGH (ref 7–18)
Bilirubin,Total: 0.5 mg/dL (ref 0.2–1.0)
CO2: 27 mmol/L (ref 21–32)
Calcium, Total: 8.6 mg/dL (ref 8.5–10.1)
Chloride: 104 mmol/L (ref 98–107)
Creatinine: 4.21 mg/dL — ABNORMAL HIGH (ref 0.60–1.30)
EGFR (Non-African Amer.): 15 — ABNORMAL LOW
GFR CALC AF AMER: 18 — AB
Glucose: 105 mg/dL — ABNORMAL HIGH (ref 65–99)
Osmolality: 303 (ref 275–301)
POTASSIUM: 3.5 mmol/L (ref 3.5–5.1)
SGPT (ALT): 13 U/L — ABNORMAL LOW
SODIUM: 140 mmol/L (ref 136–145)
Total Protein: 7.9 g/dL (ref 6.4–8.2)

## 2014-05-05 LAB — TROPONIN I
TROPONIN-I: 0.04 ng/mL
Troponin-I: 0.04 ng/mL

## 2014-05-05 LAB — CBC
HCT: 33.4 % — ABNORMAL LOW (ref 40.0–52.0)
HGB: 9.9 g/dL — ABNORMAL LOW (ref 13.0–18.0)
MCH: 24.8 pg — AB (ref 26.0–34.0)
MCHC: 29.5 g/dL — AB (ref 32.0–36.0)
MCV: 84 fL (ref 80–100)
Platelet: 256 10*3/uL (ref 150–440)
RBC: 3.98 10*6/uL — ABNORMAL LOW (ref 4.40–5.90)
RDW: 19.5 % — AB (ref 11.5–14.5)
WBC: 4.6 10*3/uL (ref 3.8–10.6)

## 2014-05-05 LAB — LIPASE, BLOOD: LIPASE: 68 U/L — AB (ref 73–393)

## 2014-05-05 LAB — PRO B NATRIURETIC PEPTIDE: B-Type Natriuretic Peptide: 85459 pg/mL — ABNORMAL HIGH (ref 0–125)

## 2014-05-05 LAB — MAGNESIUM: Magnesium: 1.7 mg/dL — ABNORMAL LOW

## 2014-05-06 DIAGNOSIS — I5023 Acute on chronic systolic (congestive) heart failure: Secondary | ICD-10-CM

## 2014-05-06 DIAGNOSIS — I255 Ischemic cardiomyopathy: Secondary | ICD-10-CM

## 2014-05-07 ENCOUNTER — Encounter: Payer: Self-pay | Admitting: Physician Assistant

## 2014-05-07 LAB — BASIC METABOLIC PANEL
ANION GAP: 8 (ref 7–16)
BUN: 78 mg/dL — ABNORMAL HIGH (ref 7–18)
CALCIUM: 8.5 mg/dL (ref 8.5–10.1)
CHLORIDE: 102 mmol/L (ref 98–107)
Co2: 31 mmol/L (ref 21–32)
Creatinine: 4.21 mg/dL — ABNORMAL HIGH (ref 0.60–1.30)
GFR CALC AF AMER: 18 — AB
GFR CALC NON AF AMER: 15 — AB
Glucose: 87 mg/dL (ref 65–99)
Osmolality: 304 (ref 275–301)
POTASSIUM: 3 mmol/L — AB (ref 3.5–5.1)
SODIUM: 141 mmol/L (ref 136–145)

## 2014-06-02 ENCOUNTER — Ambulatory Visit: Payer: Self-pay | Admitting: Internal Medicine

## 2014-06-28 ENCOUNTER — Emergency Department: Payer: Self-pay | Admitting: Emergency Medicine

## 2014-08-02 ENCOUNTER — Ambulatory Visit: Payer: Self-pay | Admitting: Internal Medicine

## 2014-08-07 ENCOUNTER — Inpatient Hospital Stay: Payer: Self-pay | Admitting: Internal Medicine

## 2014-08-07 LAB — CBC
HCT: 38.4 % — AB (ref 40.0–52.0)
HGB: 11.6 g/dL — ABNORMAL LOW (ref 13.0–18.0)
MCH: 26.6 pg (ref 26.0–34.0)
MCHC: 30.2 g/dL — ABNORMAL LOW (ref 32.0–36.0)
MCV: 88 fL (ref 80–100)
PLATELETS: 149 10*3/uL — AB (ref 150–440)
RBC: 4.35 10*6/uL — ABNORMAL LOW (ref 4.40–5.90)
RDW: 19.2 % — ABNORMAL HIGH (ref 11.5–14.5)
WBC: 11.5 10*3/uL — ABNORMAL HIGH (ref 3.8–10.6)

## 2014-08-07 LAB — BASIC METABOLIC PANEL
Anion Gap: 11 (ref 7–16)
BUN: 90 mg/dL — ABNORMAL HIGH (ref 7–18)
CALCIUM: 8.8 mg/dL (ref 8.5–10.1)
CO2: 22 mmol/L (ref 21–32)
Chloride: 107 mmol/L (ref 98–107)
Creatinine: 4.91 mg/dL — ABNORMAL HIGH (ref 0.60–1.30)
EGFR (African American): 15 — ABNORMAL LOW
GFR CALC NON AF AMER: 12 — AB
Glucose: 76 mg/dL (ref 65–99)
OSMOLALITY: 306 (ref 275–301)
POTASSIUM: 4.5 mmol/L (ref 3.5–5.1)
SODIUM: 140 mmol/L (ref 136–145)

## 2014-08-07 LAB — TROPONIN I
TROPONIN-I: 0.14 ng/mL — AB
TROPONIN-I: 0.21 ng/mL — AB

## 2014-08-07 LAB — PRO B NATRIURETIC PEPTIDE: B-TYPE NATIURETIC PEPTID: 116174 pg/mL — AB (ref 0–125)

## 2014-08-08 ENCOUNTER — Other Ambulatory Visit: Payer: Self-pay | Admitting: Physician Assistant

## 2014-08-08 DIAGNOSIS — N184 Chronic kidney disease, stage 4 (severe): Secondary | ICD-10-CM

## 2014-08-08 DIAGNOSIS — R7989 Other specified abnormal findings of blood chemistry: Secondary | ICD-10-CM

## 2014-08-08 DIAGNOSIS — I255 Ischemic cardiomyopathy: Secondary | ICD-10-CM

## 2014-08-08 DIAGNOSIS — J9621 Acute and chronic respiratory failure with hypoxia: Secondary | ICD-10-CM

## 2014-08-08 LAB — CBC WITH DIFFERENTIAL/PLATELET
BASOS ABS: 0 10*3/uL (ref 0.0–0.1)
Basophil %: 0.2 %
Eosinophil #: 0 10*3/uL (ref 0.0–0.7)
Eosinophil %: 0 %
HCT: 36.2 % — ABNORMAL LOW (ref 40.0–52.0)
HGB: 11.1 g/dL — ABNORMAL LOW (ref 13.0–18.0)
Lymphocyte #: 1.2 10*3/uL (ref 1.0–3.6)
Lymphocyte %: 8.4 %
MCH: 26.9 pg (ref 26.0–34.0)
MCHC: 30.8 g/dL — ABNORMAL LOW (ref 32.0–36.0)
MCV: 87 fL (ref 80–100)
MONOS PCT: 5 %
Monocyte #: 0.7 x10 3/mm (ref 0.2–1.0)
NEUTROS ABS: 12.7 10*3/uL — AB (ref 1.4–6.5)
Neutrophil %: 86.4 %
PLATELETS: 133 10*3/uL — AB (ref 150–440)
RBC: 4.15 10*6/uL — ABNORMAL LOW (ref 4.40–5.90)
RDW: 19.5 % — AB (ref 11.5–14.5)
WBC: 14.6 10*3/uL — ABNORMAL HIGH (ref 3.8–10.6)

## 2014-08-08 LAB — BASIC METABOLIC PANEL
Anion Gap: 11 (ref 7–16)
BUN: 90 mg/dL — AB (ref 7–18)
CALCIUM: 8.6 mg/dL (ref 8.5–10.1)
Chloride: 106 mmol/L (ref 98–107)
Co2: 22 mmol/L (ref 21–32)
Creatinine: 5.3 mg/dL — ABNORMAL HIGH (ref 0.60–1.30)
EGFR (African American): 14 — ABNORMAL LOW
GFR CALC NON AF AMER: 11 — AB
GLUCOSE: 108 mg/dL — AB (ref 65–99)
Osmolality: 306 (ref 275–301)
POTASSIUM: 4.8 mmol/L (ref 3.5–5.1)
Sodium: 139 mmol/L (ref 136–145)

## 2014-08-08 LAB — TROPONIN I: Troponin-I: 0.25 ng/mL — ABNORMAL HIGH

## 2014-08-09 LAB — CBC WITH DIFFERENTIAL/PLATELET
Basophil #: 0 10*3/uL (ref 0.0–0.1)
Basophil %: 0.4 %
EOS PCT: 0.1 %
Eosinophil #: 0 10*3/uL (ref 0.0–0.7)
HCT: 35.7 % — AB (ref 40.0–52.0)
HGB: 11 g/dL — ABNORMAL LOW (ref 13.0–18.0)
LYMPHS PCT: 12.2 %
Lymphocyte #: 1 10*3/uL (ref 1.0–3.6)
MCH: 27.1 pg (ref 26.0–34.0)
MCHC: 30.7 g/dL — ABNORMAL LOW (ref 32.0–36.0)
MCV: 88 fL (ref 80–100)
MONOS PCT: 6.8 %
Monocyte #: 0.6 x10 3/mm (ref 0.2–1.0)
NEUTROS ABS: 6.8 10*3/uL — AB (ref 1.4–6.5)
Neutrophil %: 80.5 %
Platelet: 128 10*3/uL — ABNORMAL LOW (ref 150–440)
RBC: 4.05 10*6/uL — ABNORMAL LOW (ref 4.40–5.90)
RDW: 20 % — AB (ref 11.5–14.5)
WBC: 8.5 10*3/uL (ref 3.8–10.6)

## 2014-08-09 LAB — BASIC METABOLIC PANEL
ANION GAP: 10 (ref 7–16)
BUN: 109 mg/dL — ABNORMAL HIGH (ref 7–18)
CALCIUM: 8.5 mg/dL (ref 8.5–10.1)
CHLORIDE: 105 mmol/L (ref 98–107)
CO2: 23 mmol/L (ref 21–32)
Creatinine: 6.46 mg/dL — ABNORMAL HIGH (ref 0.60–1.30)
EGFR (African American): 11 — ABNORMAL LOW
EGFR (Non-African Amer.): 9 — ABNORMAL LOW
Glucose: 113 mg/dL — ABNORMAL HIGH (ref 65–99)
OSMOLALITY: 311 (ref 275–301)
POTASSIUM: 4.8 mmol/L (ref 3.5–5.1)
Sodium: 138 mmol/L (ref 136–145)

## 2014-08-10 LAB — BASIC METABOLIC PANEL
Anion Gap: 11 (ref 7–16)
BUN: 114 mg/dL — ABNORMAL HIGH (ref 7–18)
CHLORIDE: 104 mmol/L (ref 98–107)
CREATININE: 6.69 mg/dL — AB (ref 0.60–1.30)
Calcium, Total: 8.3 mg/dL — ABNORMAL LOW (ref 8.5–10.1)
Co2: 22 mmol/L (ref 21–32)
EGFR (African American): 11 — ABNORMAL LOW
EGFR (Non-African Amer.): 9 — ABNORMAL LOW
Glucose: 128 mg/dL — ABNORMAL HIGH (ref 65–99)
Osmolality: 312 (ref 275–301)
POTASSIUM: 5 mmol/L (ref 3.5–5.1)
Sodium: 137 mmol/L (ref 136–145)

## 2014-08-10 LAB — CBC WITH DIFFERENTIAL/PLATELET
BASOS PCT: 0.3 %
Basophil #: 0 10*3/uL (ref 0.0–0.1)
EOS ABS: 0 10*3/uL (ref 0.0–0.7)
Eosinophil %: 0 %
HCT: 35.4 % — ABNORMAL LOW (ref 40.0–52.0)
HGB: 10.9 g/dL — ABNORMAL LOW (ref 13.0–18.0)
Lymphocyte #: 0.5 10*3/uL — ABNORMAL LOW (ref 1.0–3.6)
Lymphocyte %: 9.1 %
MCH: 27 pg (ref 26.0–34.0)
MCHC: 30.9 g/dL — AB (ref 32.0–36.0)
MCV: 87 fL (ref 80–100)
MONO ABS: 0.1 x10 3/mm — AB (ref 0.2–1.0)
Monocyte %: 1.3 %
NEUTROS PCT: 89.3 %
Neutrophil #: 4.8 10*3/uL (ref 1.4–6.5)
PLATELETS: 138 10*3/uL — AB (ref 150–440)
RBC: 4.05 10*6/uL — AB (ref 4.40–5.90)
RDW: 20.2 % — ABNORMAL HIGH (ref 11.5–14.5)
WBC: 5.4 10*3/uL (ref 3.8–10.6)

## 2014-08-10 LAB — CLOSTRIDIUM DIFFICILE(ARMC)

## 2014-08-12 LAB — CULTURE, BLOOD (SINGLE)

## 2014-08-16 ENCOUNTER — Emergency Department: Payer: Self-pay | Admitting: Emergency Medicine

## 2014-08-16 LAB — COMPREHENSIVE METABOLIC PANEL
ALBUMIN: 2.5 g/dL — AB (ref 3.4–5.0)
ALK PHOS: 99 U/L
Anion Gap: 11 (ref 7–16)
BUN: 143 mg/dL — ABNORMAL HIGH (ref 7–18)
Bilirubin,Total: 0.6 mg/dL (ref 0.2–1.0)
CO2: 22 mmol/L (ref 21–32)
CREATININE: 6.55 mg/dL — AB (ref 0.60–1.30)
Calcium, Total: 8 mg/dL — ABNORMAL LOW (ref 8.5–10.1)
Chloride: 109 mmol/L — ABNORMAL HIGH (ref 98–107)
EGFR (African American): 11 — ABNORMAL LOW
EGFR (Non-African Amer.): 9 — ABNORMAL LOW
Glucose: 101 mg/dL — ABNORMAL HIGH (ref 65–99)
OSMOLALITY: 330 (ref 275–301)
POTASSIUM: 4.8 mmol/L (ref 3.5–5.1)
SGOT(AST): 42 U/L — ABNORMAL HIGH (ref 15–37)
SGPT (ALT): 161 U/L — ABNORMAL HIGH
SODIUM: 142 mmol/L (ref 136–145)
Total Protein: 6 g/dL — ABNORMAL LOW (ref 6.4–8.2)

## 2014-08-16 LAB — CBC WITH DIFFERENTIAL/PLATELET
BASOS ABS: 0 10*3/uL (ref 0.0–0.1)
Basophil %: 0.9 %
EOS ABS: 0.5 10*3/uL (ref 0.0–0.7)
Eosinophil %: 9.8 %
HCT: 34.9 % — ABNORMAL LOW (ref 40.0–52.0)
HGB: 10.8 g/dL — ABNORMAL LOW (ref 13.0–18.0)
LYMPHS ABS: 0.8 10*3/uL — AB (ref 1.0–3.6)
Lymphocyte %: 14.9 %
MCH: 26.7 pg (ref 26.0–34.0)
MCHC: 31 g/dL — AB (ref 32.0–36.0)
MCV: 86 fL (ref 80–100)
MONO ABS: 0.4 x10 3/mm (ref 0.2–1.0)
Monocyte %: 8.2 %
NEUTROS PCT: 66.2 %
Neutrophil #: 3.6 10*3/uL (ref 1.4–6.5)
PLATELETS: 195 10*3/uL (ref 150–440)
RBC: 4.04 10*6/uL — ABNORMAL LOW (ref 4.40–5.90)
RDW: 19.9 % — AB (ref 11.5–14.5)
WBC: 5.5 10*3/uL (ref 3.8–10.6)

## 2014-08-16 LAB — PRO B NATRIURETIC PEPTIDE: B-Type Natriuretic Peptide: 92759 pg/mL — ABNORMAL HIGH (ref 0–125)

## 2014-09-02 ENCOUNTER — Ambulatory Visit: Payer: Self-pay | Admitting: Internal Medicine

## 2014-09-04 ENCOUNTER — Emergency Department: Payer: Self-pay | Admitting: Student

## 2014-09-04 LAB — PRO B NATRIURETIC PEPTIDE: B-Type Natriuretic Peptide: 37767 pg/mL — ABNORMAL HIGH (ref 0–125)

## 2014-09-04 LAB — COMPREHENSIVE METABOLIC PANEL
ALK PHOS: 56 U/L (ref 46–116)
Albumin: 1.4 g/dL — ABNORMAL LOW (ref 3.4–5.0)
Anion Gap: 8 (ref 7–16)
BILIRUBIN TOTAL: 0.2 mg/dL (ref 0.2–1.0)
BUN: 50 mg/dL — AB (ref 7–18)
CREATININE: 2.73 mg/dL — AB (ref 0.60–1.30)
Calcium, Total: <5 mg/dL — CL (ref 8.5–10.1)
Chloride: 124 mmol/L — ABNORMAL HIGH (ref 98–107)
Co2: 18 mmol/L — ABNORMAL LOW (ref 21–32)
EGFR (African American): 30 — ABNORMAL LOW
GFR CALC NON AF AMER: 25 — AB
GLUCOSE: 53 mg/dL — AB (ref 65–99)
Osmolality: 309 (ref 275–301)
POTASSIUM: 2.4 mmol/L — AB (ref 3.5–5.1)
SGOT(AST): 20 U/L (ref 15–37)
SGPT (ALT): 12 U/L — ABNORMAL LOW (ref 14–63)
Sodium: 150 mmol/L — ABNORMAL HIGH (ref 136–145)
Total Protein: 3.6 g/dL — ABNORMAL LOW (ref 6.4–8.2)

## 2014-09-04 LAB — APTT: Activated PTT: 28.2 secs (ref 23.6–35.9)

## 2014-09-04 LAB — CBC
HCT: 32.8 % — AB (ref 40.0–52.0)
HGB: 10.1 g/dL — ABNORMAL LOW (ref 13.0–18.0)
MCH: 26.7 pg (ref 26.0–34.0)
MCHC: 30.7 g/dL — ABNORMAL LOW (ref 32.0–36.0)
MCV: 87 fL (ref 80–100)
Platelet: 132 10*3/uL — ABNORMAL LOW (ref 150–440)
RBC: 3.77 10*6/uL — AB (ref 4.40–5.90)
RDW: 19.6 % — ABNORMAL HIGH (ref 11.5–14.5)
WBC: 3 10*3/uL — ABNORMAL LOW (ref 3.8–10.6)

## 2014-09-04 LAB — PROTIME-INR
INR: 1.3
Prothrombin Time: 16.2 secs — ABNORMAL HIGH

## 2014-09-04 LAB — CK TOTAL AND CKMB (NOT AT ARMC)
CK, TOTAL: 32 U/L — AB (ref 39–308)
CK-MB: 1 ng/mL (ref 0.5–3.6)

## 2014-09-04 LAB — MAGNESIUM: MAGNESIUM: 1.7 mg/dL — AB

## 2014-09-04 LAB — TROPONIN I: TROPONIN-I: 0.06 ng/mL — AB

## 2014-10-25 ENCOUNTER — Emergency Department: Payer: Self-pay | Admitting: Emergency Medicine

## 2014-10-25 LAB — BASIC METABOLIC PANEL
Anion Gap: 9 (ref 7–16)
BUN: 99 mg/dL — ABNORMAL HIGH
CALCIUM: 9 mg/dL
CO2: 27 mmol/L
Chloride: 103 mmol/L
Creatinine: 5.16 mg/dL — ABNORMAL HIGH
EGFR (African American): 12 — ABNORMAL LOW
EGFR (Non-African Amer.): 10 — ABNORMAL LOW
Glucose: 104 mg/dL — ABNORMAL HIGH
Potassium: 4.4 mmol/L
Sodium: 139 mmol/L

## 2014-10-25 LAB — CBC
HCT: 34.6 % — AB (ref 40.0–52.0)
HGB: 10.8 g/dL — AB (ref 13.0–18.0)
MCH: 27.6 pg (ref 26.0–34.0)
MCHC: 31.1 g/dL — ABNORMAL LOW (ref 32.0–36.0)
MCV: 89 fL (ref 80–100)
Platelet: 199 10*3/uL (ref 150–440)
RBC: 3.9 10*6/uL — AB (ref 4.40–5.90)
RDW: 17.8 % — ABNORMAL HIGH (ref 11.5–14.5)
WBC: 4.9 10*3/uL (ref 3.8–10.6)

## 2014-10-25 LAB — TROPONIN I: TROPONIN-I: 0.11 ng/mL — AB

## 2014-10-28 ENCOUNTER — Emergency Department: Payer: Self-pay | Admitting: Emergency Medicine

## 2014-10-28 LAB — CBC
HCT: 38.4 % — AB (ref 40.0–52.0)
HGB: 11.8 g/dL — ABNORMAL LOW (ref 13.0–18.0)
MCH: 27.5 pg (ref 26.0–34.0)
MCHC: 30.8 g/dL — AB (ref 32.0–36.0)
MCV: 89 fL (ref 80–100)
Platelet: 194 10*3/uL (ref 150–440)
RBC: 4.31 10*6/uL — ABNORMAL LOW (ref 4.40–5.90)
RDW: 17.9 % — AB (ref 11.5–14.5)
WBC: 5.1 10*3/uL (ref 3.8–10.6)

## 2014-10-28 LAB — BASIC METABOLIC PANEL
ANION GAP: 18 — AB (ref 7–16)
BUN: 98 mg/dL — ABNORMAL HIGH
CALCIUM: 9.5 mg/dL
Chloride: 100 mmol/L — ABNORMAL LOW
Co2: 22 mmol/L
Creatinine: 5.15 mg/dL — ABNORMAL HIGH
EGFR (Non-African Amer.): 10 — ABNORMAL LOW
GFR CALC AF AMER: 12 — AB
Glucose: 92 mg/dL
POTASSIUM: 4.7 mmol/L
Sodium: 140 mmol/L

## 2014-10-28 LAB — TROPONIN I: Troponin-I: 0.11 ng/mL — ABNORMAL HIGH

## 2014-11-01 LAB — CBC WITH DIFFERENTIAL/PLATELET
BASOS ABS: 0 10*3/uL (ref 0.0–0.1)
BASOS PCT: 0.3 %
EOS ABS: 0 10*3/uL (ref 0.0–0.7)
Eosinophil %: 0.2 %
HCT: 33.4 % — ABNORMAL LOW (ref 40.0–52.0)
HGB: 10.3 g/dL — AB (ref 13.0–18.0)
Lymphocyte #: 0.3 10*3/uL — ABNORMAL LOW (ref 1.0–3.6)
Lymphocyte %: 2.9 %
MCH: 27.4 pg (ref 26.0–34.0)
MCHC: 30.9 g/dL — AB (ref 32.0–36.0)
MCV: 89 fL (ref 80–100)
Monocyte #: 0.7 x10 3/mm (ref 0.2–1.0)
Monocyte %: 6.4 %
NEUTROS PCT: 90.2 %
Neutrophil #: 10 10*3/uL — ABNORMAL HIGH (ref 1.4–6.5)
Platelet: 139 10*3/uL — ABNORMAL LOW (ref 150–440)
RBC: 3.76 10*6/uL — ABNORMAL LOW (ref 4.40–5.90)
RDW: 18.1 % — AB (ref 11.5–14.5)
WBC: 11.1 10*3/uL — AB (ref 3.8–10.6)

## 2014-11-01 LAB — BASIC METABOLIC PANEL
ANION GAP: 13 (ref 7–16)
BUN: 123 mg/dL — AB
Calcium, Total: 8.8 mg/dL — ABNORMAL LOW
Chloride: 103 mmol/L
Co2: 24 mmol/L
Creatinine: 7.43 mg/dL — ABNORMAL HIGH
EGFR (Non-African Amer.): 7 — ABNORMAL LOW
GFR CALC AF AMER: 8 — AB
Glucose: 102 mg/dL — ABNORMAL HIGH
Potassium: 4.9 mmol/L
Sodium: 140 mmol/L

## 2014-11-01 LAB — URINALYSIS, COMPLETE
Bilirubin,UR: NEGATIVE
GLUCOSE, UR: NEGATIVE mg/dL (ref 0–75)
Ketone: NEGATIVE
Nitrite: NEGATIVE
Ph: 5 (ref 4.5–8.0)
Protein: 100
Specific Gravity: 1.011 (ref 1.003–1.030)

## 2014-11-01 LAB — TROPONIN I: Troponin-I: 0.14 ng/mL — ABNORMAL HIGH

## 2014-11-02 ENCOUNTER — Inpatient Hospital Stay
Admit: 2014-11-02 | Disposition: A | Discharge status: Other Acute Inpatient Hospital | Payer: Self-pay | Attending: Internal Medicine | Admitting: Internal Medicine

## 2014-11-02 LAB — TROPONIN I: Troponin-I: 0.15 ng/mL — ABNORMAL HIGH

## 2014-11-22 NOTE — H&P (Signed)
PATIENT NAME:  Chris Howe, Chris Howe MR#:  [id number] DATE OF BIRTH:  Apr 29, 1941  DATE OF ADMISSION:  05/26/2013  REFERRING PHYSICIAN:  Dr. York Cerise  PRIMARY CARE PHYSICIAN: Nonlocal, states out of [place].   CHIEF COMPLAINT:  Not feeling well.   HISTORY OF PRESENT ILLNESS: Mr. Chris Howe is a 74 year old African-American gentleman with past medical history of COPD non-O2 dependent, systolic congestive heart failure, ischemic cardiomyopathy with ejection fraction 15%, pulmonary hypertension, coronary artery disease, past N-STEMI, as well as chronic kidney disease, who is presenting with generalized fatigue and weakness. He was recently discharged from the hospital in [facility] for similar symptoms. Records are still pending at this time that have been requested. He states that all his medications were adjusted and he has been feeling poor ever since. Describes decreased p.o. intake, as well as productive cough of clear sputum which has been present for over one month. He also mentions shortness of breath, as he describes as orthopnea, however, denies any dyspnea on exertion, chest pain, palpitations, sick contacts, fevers, chills, edema or PND. On arrival, found to be markedly hypertensive 186/109. He has been given Lasix and nitroglycerin by the Emergency Department staff and blood pressure remains elevated. Currently, he is without complaint, other than he is thirsty.    REVIEW OF SYSTEMS:  CONSTITUTIONAL: Positive for generalized fatigue and weakness. Denies any fevers, chills, pain or weight changes.  EYES: Denies blurred vision, double vision.  ENT: Denies ear pain, tinnitus or hearing loss.  RESPIRATORY: Positive for cough as above. Denies wheeze, hemoptysis, shortness of breath.  CARDIOVASCULAR: Denies chest pain, edema or palpitations. Positive for orthopnea.  GASTROINTESTINAL: Denies nausea, vomiting, diarrhea, abdominal pain.  GENITOURINARY: Denies dysuria, hematuria.  ENDOCRINE: Denies  polyuria, nocturia.  HEMATOLOGIC/LYMPHATICS:  Denies easy bruising or bleeding.  SKIN: Denies any rashes or lesions.   MUSCULOSKELETAL: Denies neck pain, back pain, shoulder pain or arthritic symptoms.  NEUROLOGIC: Denies paralysis or paresthesias.  PSYCHIATRIC: Denies anxiety or depressive symptoms.  Otherwise, full review of systems performed by me is negative.   PAST MEDICAL HISTORY: Systolic congestive heart failure, ischemic cardiomyopathy ejection fraction 15%, COPD non-O2 dependent, pulmonary hypertension, hypertension, coronary artery disease status post N-STEMI,  chronic kidney disease, history of CVA as well as peripheral vascular disease.   SOCIAL HISTORY: Positive for tobacco abuse, current 1 pack a day smoker. Denies alcohol or drug use.   FAMILY HISTORY: Positive for chronic kidney disease. His brother is on dialysis.   ALLERGIES: No known drug allergies.   HOME MEDICATIONS: Coreg 25 mg p.o. b.i.d., hydralazine 100 mg p.o. b.i.d.   PHYSICAL EXAMINATION: VITAL SIGNS: Temperature 97.8, pulse 66, respirations 18, blood pressure 186/109, saturating 96% on room air. Weight 51.3 kg, BMI 17.2.  GENERAL: Chronically ill-appearing African-American gentleman who is currently in no acute distress.  Appears somewhat disheveled.  HEAD: Normocephalic, atraumatic.  EYES: Pupils equal, round, react to light. Extraocular muscles intact. No scleral icterus.  MOUTH: Dry mucosal membranes. Poor dentition. No abscess noted.  EARS, NOSE, THROAT: Throat clear without exudates. No external lesions.  NECK: Supple. No thyromegaly. No nodules. No JVD.  PULMONARY: Diffuse expiratory. There were diffuse scattered rhonchi as well as prolonged expiratory phase with wheezing. No use of accessory muscles. Good respiratory effort.  CHEST: Nontender to palpation.  CARDIOVASCULAR: S1, S2, regular rate and rhythm. No audible murmurs, rubs or gallops. No edema. Pedal pulses 2+.  GASTROINTESTINAL: Soft,  nontender, nondistended. No masses. Positive bowel sounds, no hepatosplenomegaly.  MUSCULOSKELETAL: No swelling,  clubbing or edema. Range of motion full in all extremities.   NEUROLOGIC: Cranial nerves II through XII intact. No gross focal neurological deficits. Sensation intact. Reflexes intact.  SKIN: No ulcerations, lesions, rashes or cyanosis. Skin warm, dry, turgor is intact.  PSYCHIATRIC: Mood and affect are within normal limits. Awake, alert, oriented x 3. Insight and judgment appear to be poor.   LABORATORY DATA: Chest x-ray performed revealing hyperinflated lung volumes and flattened diaphragm, but no acute cardiopulmonary process. 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 EKG revealing old lateral T inversions, otherwise normal sinus rhythm. Sodium 142, potassium 3.9, chloride 110, bicarbonate 26, BUN 53, creatinine 3.11, BNP86,000 Albumin 2.9; remainder of LFTs within normal limits. Troponin I 0.05. WBC 5.3, hemoglobin 11.2, platelets of 73.   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 1.  Malignant hypertension. Continue home antihypertensives of Coreg, hydralazine. We will also add hydralazine 10 mg IV q.6 hours as well, blood pressure greater than 180 or diastolic blood pressure greater than 110. Would also avoid labetalol or further injectable beta blockers, as he has chronic obstructive pulmonary disease with active wheezing and rhonchi. He will likely require additional medications to control his blood pressure in the future. 2.  Right upper lobe pulmonary nodule 1.5 cm. Will  need a 3 month follow up chest CT. Given his significant tobacco history, there is concern for malignancy. 3.  Elevated BNP. No clinical evidence of congestive heart failure, likely related to pulmonary hypertension, as well as ischemic cardiomyopathy with global hypokinesis and global dilatation. No clear indication to continue diuresis.  4.  Chronic kidney disease, creatinine is at baseline. 5. Protein malnutrition with albumin of 2.9, have encouraged p.o. intake. May require nutrition consult. 6.  Deep venous thrombosis prophylaxis with heparin subcutaneous.  7.  CODE STATUS: The patient is full code.   TIME SPENT: 45 minutes.     ____________________________ Cletis Athensavid K. Elverta Dimiceli, MD dkh:NTS D: 05/26/2013 23:11:53 ET T: 05/26/2013 23:38:40 ET JOB#: [id number]  cc: Cletis Athensavid K. Kona Yusuf, MD, <Dictator> Ashanta Amoroso Synetta ShadowK Kentavius Dettore MD ELECTRONICALLY SIGNED 05/27/2013 3:09

## 2014-11-22 NOTE — Discharge Summary (Signed)
PATIENT NAME:  Chris Howe, Chris Howe MR#:  [id number] DATE OF BIRTH:  [birth year]  DATE OF ADMISSION:  05/27/2013 DATE OF DISCHARGE:  05/28/2013  PRIMARY CARE PHYSICIAN: In [place] and [place].  DISCHARGE DIAGNOSES:  1.  Acute on chronic systolic congestive heart failure.  2.  Acute on chronic respiratory failure.  3.  Right upper lobe pulmonary nodule.  4.  Hypertension.  5.  Chronic kidney disease, stage IV.  6.  Tobacco abuse.  7.  Chronic obstructive pulmonary disease.   CONSULTANTS: Dr. Belia Heman with pulmonary.   IMAGING STUDIES DONE: Include a chest x-ray PA and lateral, which showed COPD and nodular densities bilaterally. CT scan of the chest without contrast showed a 1.5 cm pleural-based nodule in the right upper lobe.   ADMITTING HISTORY AND PHYSICAL: Please see detailed H and P dictated by Dr. Clint Guy on 05/26/2013. In brief, a 74 year old, African American patient with COPD, systolic CHF with ejection fraction of 15%, who presented to the hospital with chief complaint of not feeling well, had some mild shortness of breath. The patient was found to have acute on chronic systolic CHF along with malignant hypertension and admitted to the hospitalist service.   HOSPITAL COURSE:  1.  Acute on chronic systolic CHF. The patient was started on IV Lasix with which he diuresed well. By the day of discharge, the patient had improved well, not needing any oxygen, saturating greater than 92% on room air. Also, his blood pressure has improved as his CHF was treated.  2.  Right upper lobe pulmonary nodule. The patient had a 1.5 cm pleural-based right upper lobe pulmonary nodule found on CT scan of the chest, which was done without contrast. Dr. Belia Heman of pulmonology has seen the patient and has suggested the patient follow up at the [facility] for an outpatient workup. The patient is at high risk for lung cancer with his long history of smoking and he continues to smoke for which he was counseled for  greater than 3 minutes.  3.  CKD, stage III. This has been stable and followed by Dr. Cherylann Ratel with nephrology during the hospital stay.   On the day of discharge, the patient does not have any crackles on examination, normal work of breathing and was discharged home in fair condition.   DISCHARGE MEDICATIONS: Include:  1.  Coreg 25 mg oral 2 times a day.  2.  Hydralazine 100 mg oral 2 times a day.  3.  Gabapentin 300 mg oral once a day.  4.  Isosorbide mononitrate 30 mg oral 2 tablets once a day.  5.  Aspirin 325 mg oral once a day.  6.  Atorvastatin 40 mg oral once a day. 7.  Budesonide-formeterol 160/4.5 mg  2 puffs inhaled 2 times a day.  8.  Combivent Respimat 2 puffs inhaled every 6 hours as needed for wheezing.  9.  Lisinopril 10 mg daily.  10. Nitroglycerin 0.4 mg sublingual every 5 minutes as needed for chest pain.  11. MiraLax 17 grams oral once a day as needed for constipation.  12. Lasix 40 mg oral once a day.  13. Prednisone 60 mg tapered x 10 mg daily for 6 days.   DISCHARGE INSTRUCTIONS: Low-sodium diet. Activity as tolerated. Follow up with pulmonology nephrology, and PCP in [place] in 1 to 2 weeks.   Time spent on day of discharge in discharge activity was 40 minutes.  ____________________________ Molinda Bailiff Larnce Schnackenberg, MD srs:aw D: 05/30/2013 09:51:43 ET T: 05/30/2013  10:26:30 ET JOB#: [id number]  cc: Jaasia Viglione R. Alexie Samson, MD, <Dictator> [facility] N[place] Griff[place] Bali Haniyyah Sakuma MD ELECTRONICALLY SIGNED 05/30/2013 13:12

## 2014-11-23 NOTE — Consult Note (Signed)
CHIEF COMPLAINT and HISTORY:  Subjective/Chief Complaint renal failure, SOB/Flu, AAA   History of Present Illness Patient admitted with ARF adn positive flu with SOB.  Had a CT done in the ER two days ago which I have reviewed.  has 4 cm distal thoracic aortic aneurysm, and 3.2 cm distal abdominal aortic aneurysm with mild aneurysmal degeneration in right CIA.  Lots of calcificatiion.  Noncontrast due to ARF.  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  No back pain/abdominal pain.  No signs of peripheral embolization.   Also worsening CR, patient says he will not have dialysis even if it means death.   PAST MEDICAL/SURGICAL HISTORY:  Past Medical History:   COPD:    Renal Failure:    CHF:    HTN:    Denies:   ALLERGIES:  Allergies:  No Known Allergies:   HOME MEDICATIONS:  Home Medications: Medication Instructions Status  albuterol CFC free 90 mcg/inh aerosol 2-4 puff(s) inhaled every 4 hours as needed for coughing/wheezing/shortness of breath Active  furosemide 40 mg oral tablet 1 tab(s) orally once a day Active  dextromethorphan-guaiFENesin 10 mg-100 mg/5 mL oral liquid 5 milliliter(s) orally every 6 hours, As Needed, cough, congestion , As needed, cough, congestion Active  aspirin 325 mg oral tablet 1 tab(s) orally once a day Active  Combivent Respimat CFC free 100 mcg-20 mcg/inh inhalation aerosol 2 puff(s) inhaled every 6 hours, As Needed - for Wheezing Active  nitroglycerin 0.4 mg sublingual tablet 1 tab(s) sublingual every 5 minutes up to 3 doses as needed for chest pain. *if no relief call md or go to emergency room* Active  carvedilol 25 mg oral tablet 1 tab(s) orally 2 times a day Active  hydrALAZINE 100 mg oral tablet 1 tab(s) orally 2 times a day Active  gabapentin 300 mg oral capsule 1 to 2 cap(s) orally once a day (at bedtime) Active  isosorbide mononitrate 30 mg oral tablet, extended release 1 tab(s) orally once a  day Active   Family and Social History:  Family History Non-Contributory   Social History positive  tobacco, negative ETOH   + Tobacco Current (within 1 year)   Place of Living Home   Review of Systems:  Fever/Chills Yes   Cough Yes   Sputum Yes   Abdominal Pain No   Diarrhea No   Constipation No   Nausea/Vomiting No   SOB/DOE Yes   Chest Pain No   Dysuria No   Tolerating PT No   Tolerating Diet Yes   Medications/Allergies Reviewed Medications/Allergies reviewed   Physical Exam:  GEN thin, chronically ill appearing   HEENT pink conjunctivae, poor dentition   NECK No masses  trachea midline   RESP postive use of accessory muscles  rhonchi   CARD regular rate  no JVD   VASCULAR ACCESS none   ABD denies tenderness  soft  increased aortic impulse   GU no superpubic tenderness   LYMPH negative neck, negative axillae   EXTR negative cyanosis/clubbing, positive edema   SKIN normal to palpation, skin turgor good   NEURO follows commands, motor/sensory function intact   PSYCH alert, A+O to time, place, person   LABS:  Laboratory Results: LabObservation:    04-Jan-15 09:57, Echo Doppler  OBSERVATION   Reason for Test  Hepatic:    03-Jan-15 09:26, Comprehensive Metabolic Panel  Bilirubin, Total 0.7  Alkaline Phosphatase 109  45-117  NOTE: New Reference Range  06/22/13  SGPT (ALT)  155  SGOT (AST) 253  Total Protein, Serum 6.6  Albumin, Serum 2.8    04-Jan-15 03:10, Comprehensive Metabolic Panel  Bilirubin, Total 0.5  Alkaline Phosphatase 87  45-117  NOTE: New Reference Range  06/22/13  SGPT (ALT) 169  SGOT (AST) 227  Total Protein, Serum 5.7  Albumin, Serum 2.2  Routine Micro:    03-Jan-15 09:40, Influenza A + B Antigen ([facility])  Micro Text Report   INFLUENZA A+B ANTIGENS    COMMENT                   POSITIVE FOR INFLUENZA A (ANTIGEN PRESENT)    COMMENT                   NEGATIVE FOR INFLUENZA B (ANTIGEN ABSENT)     ANTIBIOTIC   Comment 1..   POSITIVE FOR INFLUENZA A (ANTIGEN PRESENT) A negative result does not exclude influenza.  Correlation with clinical impression is required.  Comment 2..   NEGATIVE FOR INFLUENZA B (ANTIGEN ABSENT)   Result(s) reported on 04 Aug 2013 at 10:07AM.  Routine Chem:    03-Jan-15 09:26, Comprehensive Metabolic Panel  Glucose, Serum 93  BUN 82  Creatinine (comp) 4.79  Sodium, Serum 138  Potassium, Serum 5.4  Chloride, Serum 105  CO2, Serum 23  Calcium (Total), Serum 8.6  Osmolality (calc) 300  eGFR (African American) 13  eGFR (Non-African American) 11  eGFR values <9m/min/1.73 m2 may be an indication of chronic  kidney disease (CKD).  Calculated eGFR is useful in patients with stable renal function.  The eGFR calculation will not be reliable in acutely ill patients  when serum creatinine is changing rapidly. It is not useful in   patients on dialysis. The eGFR calculation may not be applicable  to patients at the low and high extremes of body sizes, pregnant  women, and vegetarians.  Result Comment   POTASSIUM,AST - Slight hemolysis, interpret results with   - caution.   Result(s) reported on 04 Aug 2013 at 10:22AM.  Anion Gap 10    03-Jan-15 09:26, Troponin I  Result Comment   TROPONIN - RESULTS VERIFIED BY REPEAT TESTING.   - READ-BACK PROCESS PERFORMED.   - CThana FarrWINSTEAD 08-04-13 1018 [facility]   Result(s) reported on 04 Aug 2013 at 10:22AM.    03-Jan-15 13:52, Troponin I  Result Comment   TROPONIN - RESULTS VERIFIED BY REPEAT TESTING.   - PREV.CALLED BY [facility] ON 08/04/2013 @ 1018   - CAF   Result(s) reported on 04 Aug 2013 at 03:03PM.    03-Jan-15 18:38, Troponin I  Result Comment   TROPONIN - RESULTS VERIFIED BY REPEAT TESTING.   - ELEVATED TROPONIN PREVIOUSLY CALLED AT   - 1018 08/04/13.PM[facility]   Result(s) reported on 04 Aug 2013 at 07:37PM.    04-Jan-15 03:10, Comprehensive Metabolic Panel  Glucose, Serum 109  BUN 97  Creatinine (comp) 5.41  Sodium, Serum 137   Potassium, Serum 5.1  Chloride, Serum 106  CO2, Serum 22  Calcium (Total), Serum 8.0  Osmolality (calc) 305  eGFR (African American) 11  eGFR (Non-African American) 10  eGFR values <654mmin/1.73 m2 may be an indication of chronic  kidney disease (CKD).  Calculated eGFR is useful in patients with stable renal function.  The eGFR calculation will not be reliable in acutely ill patients  when serum creatinine is changing rapidly. It is not useful in   patients on dialysis. The eGFR calculation may not be applicable  to  patients at the low and high extremes of body sizes, pregnant  women, and vegetarians.  Anion Gap 9  Cardiac:    03-Jan-15 09:26, Cardiac Panel  CK, Total 366  CPK-MB, Serum 0.6  Result(s) reported on 04 Aug 2013 at 02:53PM.    03-Jan-15 09:26, Troponin I  Troponin I 0.20  0.00-0.05  0.05 ng/mL or less: NEGATIVE   Repeat testing in 3-6 hrs   if clinically indicated.  >0.05 ng/mL: POTENTIAL   MYOCARDIAL INJURY. Repeat   testing in 3-6 hrs if   clinically indicated.  NOTE: An increase or decrease   of 30% or more on serial   testing suggests a   clinically important change    03-Jan-15 13:52, CPK-MB, Serum  CPK-MB, Serum 1.0  Result(s) reported on 04 Aug 2013 at 02:42PM.    03-Jan-15 13:52, Troponin I  Troponin I 0.28  0.00-0.05  0.05 ng/mL or less: NEGATIVE   Repeat testing in 3-6 hrs   if clinically indicated.  >0.05 ng/mL: POTENTIAL   MYOCARDIAL INJURY. Repeat   testing in 3-6 hrs if   clinically indicated.  NOTE: An increase or decrease   of 30% or more on serial   testing suggests a   clinically important change    03-Jan-15 18:38, CPK-MB, Serum  CPK-MB, Serum 2.1  Result(s) reported on 04 Aug 2013 at 07:34PM.    03-Jan-15 18:38, Troponin I  Troponin I 0.37  0.00-0.05  0.05 ng/mL or less: NEGATIVE   Repeat testing in 3-6 hrs   if clinically indicated.  >0.05 ng/mL: POTENTIAL   MYOCARDIAL INJURY. Repeat   testing in 3-6 hrs if    clinically indicated.  NOTE: An increase or decrease   of 30% or more on serial   testing suggests a   clinically important change    03-Jan-15 22:19, CPK-MB, Serum  CPK-MB, Serum 2.7  Result(s) reported on 04 Aug 2013 at 10:47PM.    04-Jan-15 03:10, CPK-MB, Serum  CPK-MB, Serum 3.6  Result(s) reported on 05 Aug 2013 at 03:52AM.  Routine UA:    04-Jan-15 03:34, Urinalysis  Color (UA) Amber  Clarity (UA) Hazy  Glucose (UA) Negative  Bilirubin (UA) Negative  Ketones (UA) Trace  Specific Gravity (UA) 1.014  Blood (UA) 1+  pH (UA) 5.0  Protein (UA)   100 mg/dL  Nitrite (UA) Positive  Leukocyte Esterase (UA) Negative  Result(s) reported on 05 Aug 2013 at 03:55AM.  RBC (UA) 15 /HPF  WBC (UA) 4 /HPF  Bacteria (UA)   NONE SEEN  Epithelial Cells (UA) 1 /HPF  Mucous (UA) PRESENT  Granular Cast (UA) 7 /LPF  Amorphous Crystal (UA) PRESENT  Result(s) reported on 05 Aug 2013 at 03:55AM.  Routine Hem:    03-Jan-15 09:26, Hemogram, Platelet Count  WBC (CBC) 8.3  RBC (CBC) 4.05  Hemoglobin (CBC) 11.2  Hematocrit (CBC) 34.8  Platelet Count (CBC) 140  Result(s) reported on 04 Aug 2013 at 09:46AM.  MCV 86  MCH 27.7  MCHC 32.2  RDW 18.0    04-Jan-15 03:10, CBC Profile  WBC (CBC) 14.1  RBC (CBC) 3.86  Hemoglobin (CBC) 10.5  Hematocrit (CBC) 33.0  Platelet Count (CBC) 107  MCV 86  MCH 27.2  MCHC 31.8  RDW 17.6  Neutrophil % 90.5  Lymphocyte % 4.7  Monocyte % 4.2  Eosinophil % 0.0  Basophil % 0.6  Neutrophil # 12.8  Lymphocyte # 0.7  Monocyte # 0.6  Eosinophil # 0.0  Basophil # 0.1  Result(s)  reported on 05 Aug 2013 at 09:17AM.  Bands 53  Segmented Neutrophils 33  Lymphocytes 2  Monocytes 5  Metamyelocyte 7  Diff Comment 1   POLYCHROMASIA  Diff Comment 2   BURR CELLS  Diff Comment 3   ACANTHOCYTES  Diff Comment 4   TARGET CELLS  Diff Comment 5   PLTS VARIED IN SIZE   Result(s) reported on 05 Aug 2013 at 09:17AM.    04-Jan-15 03:10, Manual Differential   Manual Diff   MANUAL DIFF DONE   Result(s) reported on 05 Aug 2013 at 09:18AM.   RADIOLOGY:  Radiology Results: XRay:    30-Jan-14 06:57, Chest Portable Single View  Chest Portable Single View  REASON FOR EXAM:    dyspnea  COMMENTS:       PROCEDURE: DXR - DXR PORTABLE CHEST SINGLE VIEW  - Aug 31 2012  6:57AM     RESULT: Comparison is made to prior study dated 02/08/2012.    Findings: The cardiac silhouette is enlarged. No focal regions of   consolidation are identified. A mild area of increased density projects   in the region of the right middle lobe. The visualized bony skeleton is   unremarkable.     IMPRESSION:    1. Atelectasis versus infiltrate, mild right middle lobe.  2. Cardiomegaly.    Thank you for the opportunity to contribute to the care of your patient.         Verified By: Mikki Santee, M.D., MD    08-Feb-14 09:12, Chest Portable Single View  Chest Portable Single View  REASON FOR EXAM:    recent pna, severe cough  COMMENTS:       PROCEDURE: DXR - DXR PORTABLE CHEST SINGLE VIEW  - Sep 09 2012  9:12AM     RESULT: Comparison made to prior study 08/31/2012. Stable severe   cardiomegaly. Mild platelike areas of atelectasis versus scarring. Mild   persistent infiltrate right middle lobe.    IMPRESSION:   1. Mild  infiltrate right middle lobe consistent with pneumonia.  2. Severe stable cardiomegaly. No congestive heart failure.        Verified By: Osa Craver, M.D., MD    [phone] 06:56, Chest PA and Lateral  Chest PA and Lateral  REASON FOR EXAM:    sob  COMMENTS:       PROCEDURE: DXR - DXR CHEST PA (OR AP) AND LATERAL  - Dec 08 2012  6:56AM     RESULT: Comparison: None    Findings:     PA and lateral chest radiographs are provided. The lungs are   hyperinflated likely secondary to COPD. There is no focal parenchymal   opacity, pleural effusion, or pneumothorax. The heart size is enlarged..    The osseous structures are  unremarkable.    IMPRESSION:   No acute disease of the chest.    Dictation Site: 1        Verified By: Jennette Banker, M.D., MD    25-Oct-14 18:47, Chest PA and Lateral  Chest PA and Lateral  REASON FOR EXAM:    cough  COMMENTS:       PROCEDURE: DXR - DXR CHEST PA (OR AP) AND LATERAL  - May 26 2013  6:47PM     RESULT: Two-view chest 05/26/2013 comparison to prior dated 12/08/2012    Findings: A 1.48 cm nodular density projects within the right upper lobe.   Different considerations are atelectasis versus infiltrate versus  possibly confluence of densities. Primary pulmonary nodule cannot be   excluded and repeat evaluation in 7 to 10 days is recommended. This   finding persists furtherevaluation with contrasted chest CT recommended.    The lungs are hyperinflated. A second area of crescent shaped density   projects within the mid periphery of the left hemithorax measuring 1.2     cm. Similar recommendations as described previously. Cardiac silhouette   is enlarged. Visualized bony skeleton is unremarkable. No focal regions   of consolidation focal infiltrates.    IMPRESSION:  Indeterminate nodular densities within the right left   hemithoraces as described above repeat surveillance evaluation   recommended.  2. COPD        Verified By: Mikki Santee, M.D., MD    [phone] 15:35, Chest PA and Lateral  Chest PA and Lateral  REASON FOR EXAM:    cough, sob  COMMENTS:       PROCEDURE: DXR - DXR CHEST PA (OR AP) AND LATERAL  - Jun 25 2013  3:35PM     CLINICAL DATA:  Shortness of breath    EXAM:  CHEST  2 VIEW    COMPARISON:  05/26/2013    FINDINGS:  Lungs are hyperexpanded,consistent with emphysema. Ill-defined  nodular density overlying the right upper lung again noted, and  characterized on the previous study has a 1.5 cm pleural-based  nodule. The cardio pericardial silhouette is enlarged. Imaged bony  structures ofthe thorax are intact.      IMPRESSION:  Emphysema without edema or focal pneumonia.    1.5 cm pleural-based nodule in the right upper lung, better  characterized on the recent chest CT. The CT report recommended  PET-CT or surveillance as neoplasm was not excluded.      Electronically Signed    By: Misty Stanley M.D.    On: 06/25/2013 15:40     Verified By: ERIC A. MANSELL, M.D.,    02-Jan-15 09:21, Chest PA and Lateral  Chest PA and Lateral  REASON FOR EXAM:    cough, sob  COMMENTS:       PROCEDURE: DXR - DXR CHEST PA (OR AP) AND LATERAL  - Aug 03 2013  9:21AM     CLINICAL DATA:  Cough and shortness of breath.    EXAM:  CHEST  2 VIEW    COMPARISON:  Chest x-ray 06/25/2013.  Chest CT 05/26/2013.    FINDINGS:  Pulmonary nodule in the right upper lung is again noted, is better  seen with chest CT which demonstrated 1.5 cm pleural based nodule  and PET-CT was recommended for further evaluation. Stable  cardiomegaly. Pulmonary vascularityis normal. Mild basilar pleural  thickening and/or tiny effusions with mild basilar atelectasis. No  pneumothorax. No acute bony abnormality.     IMPRESSION:  1. Nodular density in the right upper lung is again noted. CT better  demonstrates this finding. CT of 05/26/2013 revealed a 1.5 cm  pleural based pulmonary nodule for which PET-CT was recommended .  2. Mild basilar atelectasis. Small basilar pleural effusions cannot  be excluded.  3. Stable cardiomegaly. Given the degree of cardiac enlargement, a  process such is pericardial effusion, cardiomyopathy, or valvular  disease could not be excluded. Cardiac ECHO may prove useful for  further evaluation.  Electronically Signed    By: Marcello Moores  Register    On: 08/03/2013 09:39         Verified By: Osa Craver, M.D., MD    04-Jan-15 08:10, Chest  PA and Lateral  Chest PA and Lateral  REASON FOR EXAM:    SOB  COMMENTS:       PROCEDURE: DXR - DXR CHEST PA (OR AP) AND LATERAL  - Aug 05 2013  8:10AM      CLINICAL DATA:  Shortness of breath.    EXAM:  CHEST  2 VIEW    COMPARISON:  08/03/2013    FINDINGS:  The patient has developed patchy airspace densities in the right  lower lung. Findings are most compatible with pneumonia or  aspiration. Lucency in the upper lungs are consistent with  emphysema. There is stable cardiomegaly. Patient has a known  pleural-based nodule in the posterior upper chest which is not well  characterized on these radiographs.     IMPRESSION:  Patchy airspace disease in the right lower lung. Findings are most  compatible with pneumonia or aspiration.    Cardiomegaly.      Electronically Signed    By: Markus Daft M.D.    On: 08/05/2013 08:15     Verified By: Burman Riis, M.D.,  LabUnknown:    30-Jan-14 06:57, Chest Portable Single View  PACS Image    08-Feb-14 09:12, Chest Portable Single View  PACS Image    09-May-14 06:56, Chest PA and Lateral  PACS Image    25-Oct-14 18:47, Chest PA and Lateral  PACS Image    25-Oct-14 20:50, CT Chest Without Contrast  PACS Image    24-Nov-14 15:35, Chest PA and Lateral  PACS Image    02-Jan-15 08:34, CT Abdomen Pelvis WO for [facility]  PACS Image    02-Jan-15 09:21, Chest PA and Lateral  PACS Image    04-Jan-15 08:10, Chest PA and Lateral  PACS Image  CT:    25-Oct-14 20:50, CT Chest Without Contrast  CT Chest Without Contrast  REASON FOR EXAM:    worsening dyspnea, productive cough, R-sided   rales/rhonchi  COMMENTS:       PROCEDURE: CT  - CT CHEST WITHOUT CONTRAST  - May 26 2013  8:50PM     RESULT: Chest CT dated 05/26/2013    Technique: Helical noncontrasted 3 mm sections were obtained from the   thoracic inlet through the lung bases.    Findings: Advanced emphysema. A 1.5 cm pleural-based nodule projects   within the posterior aspect of the right upper lobe. There is no evidence   of effusion nor pneumothorax. The mediastinum and hilar regions and   structures are grossly unremarkable.  Calcified tortuous thoracic aorta. A     4.1 cm lower descending thoracic aortic aneurysm. Bilateral renal cysts.   There is no evidence of adenopathy.    IMPRESSION:   1. Advanced emphysema  2. 1.5 cm pleural based nodule posterior aspect of the right upper lobe   concerning for malignancy. Surveillance evaluation with CT in 3 months is   recommended, PET CT or CT guided biopsy recommended if and as clinically   warranted.  3. Calcified tortuous thoracic aorta. 4.1 cm lower descending thoracic   aortic aneurysm.  4. Dr. Karma Greaser of the emergency department was informed of these findings   via a preliminary faxed report.      Verified By: Mikki Santee, M.D., MD    02-Jan-15 08:34, CT Abdomen Pelvis WO for [facility]  CT Abdomen Pelvis WO for Stone  REASON FOR EXAM:    flank pain, hematuria, no prior  COMMENTS:       PROCEDURE: CT  - CT  ABDOMEN /PELVIS WO (STONE)  - Aug 03 2013  8:34AM     CLINICAL DATA:  Hematuria, flank pain    EXAM:  CT ABDOMEN AND PELVIS WITHOUT CONTRAST    TECHNIQUE:  Multidetector CT imaging of the abdomen and pelvis was performed  following the standard protocol without IV contrast.    COMPARISON:  Chest CT -05/26/2013  FINDINGS:  The lack of intravenous contrast limits the ability to evaluate  solid abdominal organs.    There are multiple bilateral hypo attenuating renal lesions which  are incompletely evaluated without intravenous contrast though  favored to represent renal cysts. Dominant left-sided partially  exophytic lesion arising from the anterior aspect of the interpolar  region of the left kidney measures approximately 4.5 x 4.7 cm (image  24, series 2), while the dominant partially exophytic hypo  attenuating lesion arising from the posterior interpolar region of  the right kidney measures approximately 3.2 x 3.7 cm (image 31,  series 2). Calcifications within the bilateral renal hila are  favored to be vascular in etiology. No definite  renal stones. No  stones are seen along the expected course of either ureter or the  urinary bladder. No urinary obstruction or perinephric stranding.    The prostate is enlarged with mass effect upon the undersurface of  the bladder. There is apparent mild diffuse thickening of the  urinary bladder wall, possibly accentuated due to underdistention.  There isa small amount of free fluid within the pole cul-de-sac  (image 61, series 2).    There is fusiform aneurysmal dilatation of the imaged caudal aspect  of the thoracic aorta measuring approximately 4 cm in greatest  oblique axial dimension (image 7, series 2). There is aneurysmal  dilatation of the infrarenal abdominal aorta measuring approximately  3.2 cm in diameter (image 25, series 2). Additionally, there is  partially calcified presumed mural thrombus within the caudal aspect  of the infrarenal abdominal aorta (images 27, 30 and 35, series 2;  coronal image 48, series 5). There is no definitive periaortic  stranding or mural wall calcification disruption on this noncontrast  examination. 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 The left common iliac artery appears to be a of normal caliber.  There is extensive calcified atherosclerotic plaque within in the  bilateral external iliac arteries, left greater than right.    Normal hepatic contour. There is apparent radiopaque debris within  the gallbladder. No definite ascites.    There is mild thickening of the lateral limb and crux of the left  adrenal gland without discrete nodule. Normal noncontrast appearance  of the right adrenal gland. The pancreas and spleen appear slightly  atrophic.  Moderate colonic stool burden without evidence of obstruction.  Scattered colonic diverticulosis without evidence of diverticulitis  on this noncontrast examination. Normal noncontrast appearance of  the  appendix. No pneumoperitoneum, pneumatosis or portal venous gas.    No bulky retroperitoneal, mesenteric, pelvic or inguinal  lymphadenopathy on this noncontrast examination, though note, the  examination is further degraded secondary to lack of significant  mesenteric fat.    Limited visualization of the lower thorax demonstrates  similar-appearing linear heterogeneous opacities within the imaged  caudal aspect of the right middle lobe (image 1, series 4). There is  apparent worsening heterogeneous opacities within the right lower  lobe with relative area of confluence about the right infrahilar  lung (image 1, series 4).  Minimal subsegmental atelectasis within  the knee left lower lobe. No pleural effusion.    Marked cardiomegaly. There is decreased attenuation of the intra  cardiac blood pool suggestive of anemia. No definite pericardial  effusion.    No acute or aggressive osseus abnormalities. There is mild diffuse  body wall anasarca.     IMPRESSION:  1. No definite explanation for patient's hematuria and flank pain.  Specifically, no evidence of nephrolithiasis or urinary obstruction.  2. Aneurysmal dilatation of the imaged caudal aspect of the thoracic  aorta and abdominal aorta with the abdominal aorta measuring  approximately 3.2 cm in diameter. Additionally, there is partially  calcified mural thrombus within the caudal aspect of the abdominal  aorta, incompletely evaluated on this noncontrast examination.  Comparison withprior outside contrast enhanced examinations (if  available) is recommended. If no comparisons exist, further  evaluation with CTA of the chest, abdomen and pelvis is recommended.  3. Bilateral hypo attenuating renal lesions, incompletely evaluated  on this noncontrast examination though presumably representative  renal cysts. These renal lesions may be further characterized at the  time recommended contrast-enhanced abdominal CT.  4. Enlarged  prostate with mass effect on the undersurface of bladder  and associated bladder wall thickening, constellation of findings  could be attributable to bladder outlet obstruction though cystitis  may have a similar appearance. Clinical correlation is advised.  5. Suspected cholelithiasis and/or radiopaquegallbladder sludge.  Further evaluation could be performed with gallbladder ultrasound as  clinically indicated.  6. Marked cardiomegaly with mild diffuse body wall anasarca and  minimal amounts of nonspecific free fluid within the pelvic  cul-de-sacconstellation of findings could be indicative of  congestive heart failure. Further evaluation of cardiac echo could  be performed as clinically indicated.  7. Worsening opacities within the medial aspect of the imaged right  lower lung, possibly atelectasis or developing area of infection not  excluded. Further evaluation with dedicated chest radiograph could  be performed as clinically indicated.      Electronically Signed    By: Sandi Mariscal M.D.    On: 08/03/2013 08:52         Verified By: Aileen Fass, M.D.,   ASSESSMENT AND PLAN:  Assessment/Admission Diagnosis 4.0 cm thoracic AA and 3.2. cm AAA.  Both well below threshold for repair ARF, patient declines HD even if condition worsens and this could mean death.   Plan no acute treatment needed for aneurysmal disease.  Will arrange outpatient follow up.  Would not fix aneurysms until over 5 cm in diameter, and would not do contrasted study at this time. No other recs   level 4   Electronic Signatures: Algernon Huxley (MD)  (Signed 04-Jan-15 10:48)  Authored: Chief Complaint and History, PAST MEDICAL/SURGICAL HISTORY, ALLERGIES, HOME MEDICATIONS, Family and Social History, Review of Systems, Physical Exam, LABS, RADIOLOGY, Assessment and Plan   Last Updated: 04-Jan-15 10:48 by Algernon Huxley (MD)

## 2014-11-23 NOTE — Discharge Summary (Signed)
PATIENT NAME:  Chris Howe, Chris Howe MR#:  [id number] DATE OF BIRTH:  May 02, 1941  DATE OF ADMISSION:  11/25/2013 DATE OF DISCHARGE:  11/28/2013  ADMITTING DIAGNOSIS: Lower extremity swelling.   DISCHARGE DIAGNOSES:  1.  Lower extremity swelling, felt to be due to acute on chronic systolic congestive heart failure.  2.  Chronic kidney disease, stage V with proteinuria, polycystic kidney disease and worsening renal function felt to be due to acute tubular necrosis.  3.  Malnutrition.  4.  Anemia of chronic disease.  5.  Transient diarrhea, now resolved.  6.  History of ventricular tachycardia.  7.  Chronic respiratory failure, on oxygen at home.  8.  Chronic obstructive pulmonary disease, chronic in nature, without any evidence of acute exacerbation.   PERTINENT LABS AND EVALUATIONS: Admitting glucose 90. BNP was 46,672. BUN 65, creatinine 3.58, sodium 141, potassium 3.8, chloride 99, CO2 37, potassium was 4.8, total protein 7.4, albumin 0.5. WBC 4.4, hemoglobin 9.1. Chest x-ray, PA and lateral, showed lung inflation with improved aeration of the right lower lobe bilateral interstitial opacities.   CONSULTANTS: Dr. Mady HaagensenMunsoor Lateef and Dr. Harvie JuniorPhifer.   HOSPITAL COURSE: Please refer to H and P done by the admitting physician. The patient is a 74 year old African American male. He has had a history of stage V renal failure with chronic systolic CHF, who was [facility] and was discharged on the 23rd, who is brought back to the hospital with worsening lower extremity swelling. The patient has had recurrent admissions related to his CHF. He was admitted and was treated with IV Lasix. With IV Lasix his renal function did worsen a little. He was followed by nephrology. He was given some IV fluids back. He was seen in consultation by palliative care and they made him a DNR plus home hospice was arranged. The patient's swelling is improved. He is breathing much better. He does have chronic respiratory  failure and is unchanged. He complained of diarrhea; however, that resolved. The patient had multiple other chronic complaints that were unchanged. At this point, he is doing better and home hospice has been arranged for him to discharge to. He is also going to be followed at the CHF clinic.   DISCHARGE MEDICATIONS: Nitroglycerin 0.4 sublingual p.r.n., isosorbide mononitrate 60 daily, amiodarone 200 daily, gabapentin 300, 1 tab p.o. at bedtime, carvedilol 25, 1 tab p.o. b.i.d., Spiriva 18 mcg daily, aspirin 81, 1 tab p.o. daily, Lasix 40, 2 tabs b.i.d., hydralazine 100, 1 tabs 4 times a day, guaifenesin 600, 1 tab p.o. b.i.d., ocular lubricant 1 drop to each eye every 8 hours, dextromethorphan guaifenesin 15 mL q.6 hour p.r.n. as needed for cough.   HOME OXYGEN: Yes.   DIET: Renal diet.   ACTIVITY: As tolerated.   FOLLOWUP: The patient to be followed by home hospice. Follow with primary M.D. in 1 to 2 weeks. Follow up with Dr. Mady HaagensenMunsoor Lateef  in 1 to 2 weeks.  TIME SPENT: 35 minutes on the discharge.  ____________________________ Lacie ScottsShreyang H. Allena KatzPatel, MD shp:aw D: 11/29/2013 07:54:29 ET T: 11/29/2013 08:33:27 ET JOB#: [id number]  cc: Fitz Matsuo H. Allena KatzPatel, MD, <Dictator> Charise CarwinSHREYANG H Stephfon Bovey MD ELECTRONICALLY SIGNED 11/30/2013 14:05

## 2014-11-23 NOTE — Op Note (Signed)
PATIENT NAME:  Chris Howe, [name] MR#:  [id number] DATE OF BIRTH:  [birth year]  DATE OF PROCEDURE:  08/09/2013  PREOPERATIVE DIAGNOSES:  1.  Acute renal failure in the setting chronic kidney disease.  2.  Influenza.  3.  Hypertension.   POSTOPERATIVE DIAGNOSES:  1.  Acute renal failure in the setting chronic kidney disease.  2.  Influenza.  3.  Hypertension.   PROCEDURES:  1.  Ultrasound guidance for vascular access, right femoral vein.  2.  Placement of 30 cm long Trialysis-type dialysis catheter, right femoral vein.   SURGEON: Festus BarrenJason Dew, M.D.   ANESTHESIA: Local.   ESTIMATED BLOOD LOSS: Minimal.   INDICATION FOR PROCEDURE: A 74 year old African American male with severe chronic kidney disease. He is admitted with influenza and multiple other issues and has acute renal failure. A temporary dialysis catheter is going to be placed for initiation of hemodialysis. The patient initially declined but now desires placement. The femoral location is going to be used to save the jugular veins for permanent dialysis access options which will likely be necessary.   DESCRIPTION OF THE PROCEDURE: The patient is laid flat in his floor bed. The right groin was sterilely prepped and draped and a sterile surgical field was created. The right femoral vein was visualized on ultrasound and found to widely patent. It was then accessed under direct ultrasound guidance without difficulty with a Seldinger needle. A J-wire was then placed after skin nick and dilatation. The 30 cm long Trialysis-type dialysis catheter was placed over the wire and the wire was removed. It was secured at the skin at 29 cm. All 3 lumens withdrew dark red nonpulsatile blood and flushed easily with sterile saline. Sterile dressing was placed.   ____________________________ Annice NeedyJason S. Dew, MD jsd:np D: 08/09/2013 15:07:05 ET T: 08/09/2013 20:51:04 ET JOB#: [id number]  cc: Annice NeedyJason S. Dew, MD, <Dictator> Annice NeedyJASON S DEW MD ELECTRONICALLY SIGNED  08/20/2013 11:44

## 2014-11-23 NOTE — Consult Note (Signed)
History of Present Illness:  Reason for Consult Thrombocytopenia.   HPI   Patient is a 74 year old male with multiple medical problems who presented to the emergency room with increasing shortness breath and fever.  He also was found to have acute on chronic renal failure.  Subsequent influenza testing was positive.  Currently, patient feels weak and fatigued and has a poor appetite, but otherwise feels well.  He has no neurologic complaints.  His shortness of breath has improved and he denies chest pain.  He denies any nausea, vomiting, constipation, or diarrhea.  Patient otherwise feels well and offers no further specific complaints.  PFSH:  Additional Past Medical and Surgical History Chronic renal insufficiency, COPD, hypertension, CHF, AAA.  Social history: Positive tobacco, unclear how much.  Denies alcohol.  Family history: Diabetes, CVA, CAD.   Review of Systems:  Performance Status (ECOG) 2   Review of Systems   As per HPI. Otherwise, 10 point system review was negative.   NURSING NOTES: **Vital Signs.:   05-Jan-15 11:44   Vital Signs Type: Routine   Temperature Temperature (F): 98.4   Celsius: 36.8   Temperature Source: oral   Pulse Pulse: 75   Respirations Respirations: 18   Systolic BP Systolic BP: 300   Diastolic BP (mmHg) Diastolic BP (mmHg): 67   Mean BP: 80   Pulse Ox % Pulse Ox %: 94   Pulse Ox Activity Level: At rest   Oxygen Delivery: 2L   Physical Exam:  Physical Exam General: cachectic appearing, no acute distress. Eyes: Pink conjunctiva, anicteric sclera. Lungs: Clear to auscultation bilaterally. Heart: Regular rate and rhythm. No rubs, murmurs, or gallops. Abdomen: Soft, nontender, nondistended. No organomegaly noted, normoactive bowel sounds. Musculoskeletal: No edema, cyanosis, or clubbing. Neuro: Alert, answering all questions appropriately. Cranial nerves grossly intact. Skin: No rashes or petechiae noted. Psych: Normal affect.     No Known Allergies:     albuterol CFC free 90 mcg/inh aerosol: 2-4 puff(s) inhaled every 4 hours as needed for coughing/wheezing/shortness of breath, Status: Active, Quantity: 1, Refills: 1   furosemide 40 mg oral tablet: 1 tab(s) orally once a day, Status: Active, Quantity: 30, Refills: None   dextromethorphan-guaiFENesin 10 mg-100 mg/5 mL oral liquid: 5 milliliter(s) orally every 6 hours, As Needed, cough, congestion , As needed, cough, congestion, Status: Active, Quantity: 100, Refills: None   carvedilol 25 mg oral tablet: 1 tab(s) orally 2 times a day, Status: Active, Quantity: 0, Refills: None   hydrALAZINE 100 mg oral tablet: 1 tab(s) orally 2 times a day, Status: Active, Quantity: 0, Refills: None   gabapentin 300 mg oral capsule: 1 to 2 cap(s) orally once a day (at bedtime), Status: Active, Quantity: 0, Refills: None   isosorbide mononitrate 30 mg oral tablet, extended release: 1 tab(s) orally once a day, Status: Active, Quantity: 0, Refills: None   aspirin 325 mg oral tablet: 1 tab(s) orally once a day, Status: Active, Quantity: 0, Refills: None   Combivent Respimat CFC free 100 mcg-20 mcg/inh inhalation aerosol: 2 puff(s) inhaled every 6 hours, As Needed - for Wheezing, Status: Active, Quantity: 0, Refills: None   nitroglycerin 0.4 mg sublingual tablet: 1 tab(s) sublingual every 5 minutes up to 3 doses as needed for chest pain. *if no relief call md or go to emergency room*, Status: Active, Quantity: 0, Refills: None  Laboratory Results:  Hepatic:  05-Jan-15 05:20   Albumin, Serum  1.8  Routine Chem:  05-Jan-15 05:20   Glucose, Serum  113  BUN  108  Creatinine (comp)  5.59  Sodium, Serum 138  Potassium, Serum 4.9  Chloride, Serum  108  CO2, Serum  20  Calcium (Total), Serum  7.8  Phosphorus, Serum  7.0  Anion Gap 10  Osmolality (calc) 311  eGFR (African American)  11  eGFR (Non-African American)  9 (eGFR values <50m/min/1.73 m2 may be an indication of  chronic kidney disease (CKD). Calculated eGFR is useful in patients with stable renal function. The eGFR calculation will not be reliable in acutely ill patients when serum creatinine is changing rapidly. It is not useful in  patients on dialysis. The eGFR calculation may not be applicable to patients at the low and high extremes of body sizes, pregnant women, and vegetarians.)  Routine Hem:  05-Jan-15 05:20   WBC (CBC)  15.7  RBC (CBC)  3.78  Hemoglobin (CBC)  10.3  Hematocrit (CBC)  32.3  Platelet Count (CBC)  106  MCV 85  MCH 27.4  MCHC 32.1  RDW  17.8  Neutrophil % 94.9  Lymphocyte % 2.0  Monocyte % 2.2  Eosinophil % 0.8  Basophil % 0.1  Neutrophil #  14.9  Lymphocyte #  0.3  Monocyte # 0.3  Eosinophil # 0.1  Basophil # 0.0 (Result(s) reported on 06 Aug 2013 at 07:19AM.)   Assessment and Plan: Impression:   Thrombocytopenia. Plan:   1.  Thrombocytopenia: Likely multifactorial.  Patient's platelet count was normal upon admission.  Most likely is acute infection with influenza, medication induced, and/or acute renal failure.  Continue to monitor daily CBC.  No intervention is needed at this time.  If patient's platelet count does not improve as his acute issues resolve, will initiate full workup at that point. Acute renal failure: Appreciate nephrology input.  Patient has already declined dialysis if it should come to that. Consider palliative care consultation.  consult, will follow.  Electronic Signatures: FDelight Hoh(MD)  (Signed 05-Jan-15 17:29)  Authored: HISTORY OF PRESENT ILLNESS, PFSH, ROS, NURSING NOTES, PE, ALLERGIES, HOME MEDICATIONS, LABS, ASSESSMENT AND PLAN   Last Updated: 05-Jan-15 17:29 by FDelight Hoh(MD)

## 2014-11-23 NOTE — Consult Note (Signed)
Brief Consult Note: Diagnosis: admitted with weakness and fatiuge probable pneumonia  and flu. noted to have mild troponin elevation. poor historian but denies chest pain.   Patient was seen by consultant.   Recommend further assessment or treatment.   Comments: 74 yo male with history of pvd with stable AAA admitted after several days of weakness, sob and cough. Noted to have flu and probable RLL pneumonia. Noted to have mild troponin elevation. No ischemic ekg changes. Proable demand ischemia. No evidence of pulmonary edema on cxr. Will review echo when available. Continue with Tamiflu and emperic antibiotics. Further recs pending course. Continue with asa, carvedilol and nitrates.  Electronic Signatures: Dalia HeadingFath, Ronold Hardgrove A (MD)  (Signed 04-Jan-15 10:58)  Authored: Brief Consult Note   Last Updated: 04-Jan-15 10:58 by Dalia HeadingFath, Eaven Schwager A (MD)

## 2014-11-23 NOTE — Consult Note (Signed)
   Comments   Spoke with pt's sister. Pt has agreed to home hospice. Will order hospice screen.  Dx: ICM, EF 15%    Secondary Dx: CAD, CKD IV, PVD, pulmonary HTN, CVA  Electronic Signatures: Shinichi Anguiano, Harriett SineNancy (MD)  (Signed 27-Apr-15 17:26)  Authored: Palliative Care   Last Updated: 27-Apr-15 17:26 by Ahmari Duerson, Harriett SineNancy (MD)

## 2014-11-23 NOTE — Consult Note (Signed)
General Aspect PCP: [facility] Primary Cardiologist: Dr. Rockey Situ, MD __________________  74 year old male with history of chronic systolic CHF ( EF <16% by echo7/23/15), h/o CVA, HTN, CKD stage IV, & COPD who presented to [facility] ED on 05/02/14 with increased cough, productive of green sputum. ProBNP was found to be at baseline (50000's) and he was sent home. The problem continued so he called EMS and presented to [facility] ED again. His ProBNP was found to be elevated at (85000's) so he was admitted for further evaluation and treatment.  __________________  PMH: 1) Chronic systolic CHF 2) H/o CVA 3) CKD stage IV 4) HTN 5) COPD __________________   Present Illness 74 year old male with the above problem list presented to [facility] 05/02/14 with increased cough, productive of green sputum. CXR: Cardiomegaly with chronic interstitial thickening. Cannot exclude mild pulmonary venous congestion. 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 Consider further evaluation with chest CTProBNP found to be at baseline in the 50000's and he was sent home. He presented again to [facility] ED on 05/05/14 via EMS 2/2 continued symptoms. ProBNP was found to be further elevated in at [address] and he was admitted for acute on chronic systolic CHF (SCr at baseline at 4.21).   Patient with a long history of smoking (began at age 17 with smoking 2 PPD x 45 years, quit smoking 5 months ago), ICM echo 02/21/14 with EF <20%, pseudonormal pattern of LV diastolic filling, moderately enlarged RV, severely dilated left atrium, moderately dilated right atrium, moderate to severe mitral valve regurgitation, moderate to severe tricuspid regurgitation, mildly elevated pulmonary artery systolic pressure, mildly increased left ventricular posterior wall thickness. He has previously refused cardiac cath during prior admission to [facility] for NSTEMI. Last evaluated in our outpatient clinic 09/2012. Stress testing  2013 with no ischemia, right ventricle moderately dilated, but trickier systolic function moderately reduced, left atrium moderately dilated, mild to moderate MR, moderate TR, right ventricular systolic pressure estimated at 50-60 mmHg. He was continued on his current medications at that time which included Lasix 40 mg daily, Coreg 25 mg bid, Lipitor 40 mg, ASA 325 mg, Plavix 75 mg, Imdur 30 mg, lisinopril 20 mg, and SL NTG 0.4 mg prn.   States he is taking his Lasix as 80 mg BID at home. States this productive cough of green sputum has been present for 2 years. He denies any LEE. He does note increased abdominal swelling over the past several months. He denies any chest pain except when he coughs too hard trying to get the sputum up. He is Rx'd to wear 2 L via [place] at home but wears this about 50% of the time. The other 50% of the time he wears 3 L or is on RA because he does not feel like he needs any O2 on. He sleep with 1 pillow and with the bed flat. He denies any early satiety, or PND. He has good stamina at home. He does not want an AICD at this time.   Upon his arrival to [facility] ED his ProBNP was found to be elevated per the above (85459), TnI negative x 3, SCr 4.21 (baseline around 4), K+ 3.5, albumin 2.8, hgb 9.9, EKG: NSR, 62, nonspecific st/t changes, old TWI V5-V6. CXR: Cardiomegaly with chronic interstitial thickening. Cannot exclude mild pulmonary venous congestion. 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 Consider further evaluation with chest CT. CT chest: Interval enlargement  of a right upper lobe pulmonary nodule, consistent with progressive primary bronchogenic carcinoma, no evidence of thoracic adenopathy or other metastatic disease, advanced cardiomegaly with atherosclerosis, including within the LAD. Pulmonary artery enlargement suggests pulmonary arterial hypertension, thyroid isthmic nodule extending into the upper chest. Given comorbidities,  of questionable clinical significance, new or increased small volume perihepatic ascites, undersurface irregularity of the proximal transverse aorta.Possibly a ductus diverticulum. Cannot exclude saccular aneurysm. Nonemergent CTA or MRA should be considered. Has put out 1288 since admission.   He was started on Lasix gtt 2m/hr, Coreg 25 mg bid, hydralazine 25 mg qid, Imdur 60 mg daily, aspirin 81 mg.   Physical Exam:  GEN well developed, well nourished, no acute distress   HEENT PERRL, hearing intact to voice   NECK supple   RESP normal resp effort  crackles   CARD Regular rate and rhythm  Murmur   Murmur Systolic  2/6 apex   ABD denies tenderness  soft  normal BS   EXTR negative edema   SKIN normal to palpation   NEURO cranial nerves intact   PSYCH alert, A+O to time, place, person, good insight   Review of Systems:  General: No Complaints   Skin: No Complaints   ENT: No Complaints   Eyes: No Complaints   Neck: No Complaints   Respiratory: Frequent cough  Sputum  green   Cardiovascular: Chest pain or discomfort  associated with cough   Gastrointestinal: No Complaints   Genitourinary: No Complaints   Vascular: No Complaints   Musculoskeletal: No Complaints   Neurologic: No Complaints   Hematologic: No Complaints   Endocrine: No Complaints   Psychiatric: No Complaints   Review of Systems: All other systems were reviewed and found to be negative   Medications/Allergies Reviewed Medications/Allergies reviewed   Family & Social History:  Family and Social History:  Family History Coronary Artery Disease  Diabetes Mellitus  Stroke   Social History negative ETOH, negative Illicit drugs   + Tobacco Current (within 1 year)  former heavy smoker of 90 pack years, quit 11/2013, start age 74  Place of Living Home  [place]    COPD:    Renal Failure:    CHF:    HTN:    Denies:   Home Medications: Medication Instructions Status   chlorpheniramine-HYDROcodone 8 mg-10 mg/5 mL suspension, extended release 5 mL orally every 12 hours x 7 days Active  isosorbide mononitrate 60 mg oral tablet, extended release 1 tab(s) orally once a day Active  Aspirin Enteric Coated 81 mg oral delayed release tablet 1 tab(s) orally once a day (in the morning) Active  carvedilol 25 mg oral tablet 1 tab(s) orally 2 times a day Active  temazepam 15 mg oral capsule 1-2 cap(s) orally once a day (at bedtime), As Needed for sleep Active  hydrALAZINE 100 mg oral tablet 1 tab(s) orally 3 times a day Active  gabapentin 300 mg oral capsule 1 cap(s) orally once a day (at bedtime) Active  metolazone 2.5 mg oral tablet 1 tab(s) orally 2 times a day 30 minutes before taking furosemide Active  furosemide 80 mg oral tablet 1 tab(s) orally 2 times a day Active   Lab Results:  Hepatic:  04-Oct-15 10:13   Bilirubin, Total 0.5  Alkaline Phosphatase 111 (46-116 NOTE: New Reference Range 02/19/14)  SGPT (ALT)  13 (14-63 NOTE: New Reference Range 02/19/14)  SGOT (AST) 25  Total Protein, Serum 7.9  Albumin, Serum  2.8  Routine  Chem:  04-Oct-15 10:13   B-Type Natriuretic Peptide [facility])  [phone] (Result(s) reported on 05 May 2014 at 11:08AM.)  Glucose, Serum  105  BUN  78  Creatinine (comp)  4.21  Sodium, Serum 140  Potassium, Serum 3.5  Chloride, Serum 104  CO2, Serum 27  Calcium (Total), Serum 8.6  Osmolality (calc) 303  eGFR (African American)  18  eGFR (Non-African American)  15 (eGFR values <54m/min/1.73 m2 may be an indication of chronic kidney disease (CKD). Calculated eGFR, using the MRDR Study equation, is useful in  patients with stable renal function. The eGFR calculation will not be reliable in acutely ill patients when serum creatinine is changing rapidly. It is not useful in patients on dialysis. The eGFR calculation may not be applicable to patients at the low and high extremes of body sizes, pregnant women, and vetetarians.)   Result Comment POTASSIUM/BUN/MAGNESIUM - Slight hemolysis, interpret results with  - caution. AST - Slight hemolysis, interpret results with  - caution.  Result(s) reported on 05 May 2014 at 11:04AM.  Anion Gap 9  Lipase  68 (Result(s) reported on 05 May 2014 at 10:45AM.)  Magnesium, Serum  1.7 (1.8-2.4 THERAPEUTIC RANGE: 4-7 mg/dL TOXIC: > 10 mg/dL  -----------------------)  Cardiac:  04-Oct-15 10:13   Troponin I 0.04 (0.00-0.05 0.05 ng/mL or less: NEGATIVE  Repeat testing in 3-6 hrs  if clinically indicated. >0.05 ng/mL: POTENTIAL  MYOCARDIAL INJURY. Repeat  testing in 3-6 hrs if  clinically indicated. NOTE: An increase or decrease  of 30% or more on serial  testing suggests a  clinically important change)  Routine Hem:  04-Oct-15 10:13   WBC (CBC) 4.6  RBC (CBC)  3.98  Hemoglobin (CBC)  9.9  Hematocrit (CBC)  33.4  Platelet Count (CBC) 256  MCV 84  MCH  24.8  MCHC  29.5  RDW  19.5   EKG:  EKG Interp. by me   Interpretation NSR, 62, nonspecific st/t changes, old TWI V5-V6    No Known Allergies:   Vital Signs/Nurse's Notes: **Vital Signs.:   05-Oct-15 06:00  Vital Signs Type Routine  Temperature Temperature (F) 97.9  Celsius 36.6  Temperature Source oral  Pulse Pulse 63  Respirations Respirations 20  Systolic BP Systolic BP 1277 Diastolic BP (mmHg) Diastolic BP (mmHg) 74  Mean BP 99  Pulse Ox % Pulse Ox % 93  Pulse Ox Activity Level  At rest  Oxygen Delivery 2L    Impression 74year old male with history of chronic systolic CHF ( EF <<82%by echo7/23/15), h/o CVA, HTN, CKD stage IV, & COPD [facility] increased cough, productive of green sputum. ProBNP was found to be at baseline (50000's) and he was sent home. The problem continued so [facility] called EMS [facility] His ProBNP was found to be elevated at (85000's) so he was admitted for further evaluation and treatment.   1. Acute on chronic systolic  CHF: -Echo on 74/23/5361deomnstrated an EF of <20% with pseudonormal pattern of LV diastolic filling, moderately enlarged RV, severely dilated left atrium, moderately dilated right atrium, moderate to severe mitral valve regurgitation, moderate to severe tricuspid regurgitation, mildly elevated pulmonary artery systolic pressure, mildly increased left ventricular posterior wall thickness.  -He declines any invasive therapy, including AICD. Risks and benefits of this decision were discussed with him as well as what an AICD could and could not offer him so he could make an informed decision.  He continues to decline this at this time.  -Optimize medical therapy at this time for his HF -Currently on Lasix gtt 31m/hr - home dose of Lasix we have on record is Lasix 40 mg daily with him increasing by 1 tab with a weight gain of 3 pounds. He is responding well to the Lasix gtt at the current dose having already put out 1288 ml since admission and just voided more while I was in the room. Would go slow to try and prevent SCr bump. -Nephrology is on board and has changed to Lasix IV push  -On Hydralazine 25 mg qid, Imdur 60 mg daily -Continue Coreg 25 mg bid -Stoped lisinopril 2/2 renal function  2. ICM: -CT chest demonstrates coronary athrosclerosis, including within the LAD -Not a cardiac cath candidate 2/2 renal function - optimize medical therapy -Continue Coreg, hydralazine, and Imdur -Chest pain 2/2 coughing -TnI negative x 4  3. CKD stage IV:  -SCr at baseline -Avoid nephrotoxic agents -Monitor SCr  4. HTN: -Continue current medications -On Lasix gtt  5. COPD: -No wheezing -On [place] -Per IM  6. Lung nodule with enlargement on CT: -Outpatient follow up with PCP   Electronic Signatures for Addendum Section:  AKathlyn Sacramento(MD) (Signed Addendum 05-Oct-15 09:52)  The patient was seen and examined. Agree with the above. He presented with worsening dyspnea and cough. NoJVD by exam. BNP was  above baseline. He has advanved CKD. Agree with gentle diuresis. Continue treatment Coreg, Hydrlazine and Imdur.   Electronic Signatures: AKathlyn Sacramento(MD)  (Signed 05-Oct-15 09:52)  Authored: General Aspect/Present Illness, Home Medications, Allergies DIdolina Primer RAreta Haber(PA-C)  (Signed 05-Oct-15 09:45)  Entered: General Aspect/Present Illness, History and Physical Exam, Review of System, Family & Social History, Past Medical History, Home Medications, Labs, EKG , Allergies, Vital Signs/Nurse's Notes, Impression/Plan  Authored: General Aspect/Present Illness, History and Physical Exam, Review of System, Family & Social History, Past Medical History, Home Medications, Labs, EKG , Vital Signs/Nurse's Notes, Impression/Plan   Last Updated: 05-Oct-15 09:52 by AKathlyn Sacramento(MD)

## 2014-11-23 NOTE — H&P (Signed)
PATIENT NAME:  Chris Howe, Chris Howe MR#:  [id number] DATE OF BIRTH:  1941/06/15  DATE OF ADMISSION:  11/25/2013  PRIMARY CARE PHYSICIAN: At [facility].   REFERRING EMERGENCY ROOM PHYSICIAN: Dorothea Glassman, MD  CHIEF COMPLAINT: Leg swelling.   HISTORY OF PRESENT ILLNESS: A 74 year old male who has past history of chronic renal failure, was on dialysis at one time, taken off as per some nephrologist in [place] as the patient is saying to me. He also had chronic obstructive pulmonary disease, hypertension and chronic congestive heart failure. He had worsening leg swelling for the last 2 weeks and so went to [facility]. He was admitted over there for 4 days and given IV Lasix injections, had somewhat improvement in his swelling and was discharged on 23rd of April. He was discharged with some home health aide. As per him, his swelling started getting worse and so he spoke to his advanced home care nurse. She suggested to take him to urgent care center.  They went over there. After listening to his history, they suggested to take him to hospital as they cannot do anything in urgent care for this severe swelling. His nephew did not want to drive to him all the way to [facility] and so brought him to [facility]. On further questioning, he denies any injuries to his legs. He denies any chest tightness or shortness of breath or chest pain.   REVIEW OF SYSTEMS:    CONSTITUTIONAL: Negative for fever, fatigue, weakness, pain or weight loss.  EYES: No blurring or double vision, discharge or redness.  EARS, NOSE, THROAT: No tinnitus, ear pain or hearing loss.  RESPIRATORY: No cough, wheezing, hemoptysis or shortness of breath.  CARDIOVASCULAR: No chest pain, orthopnea arrhythmia or palpitations but has severe edema on the legs.  GASTROINTESTINAL: No nausea, vomiting, diarrhea, abdominal pain.  GENITOURINARY: No dysuria, hematuria or increased frequency.  ENDOCRINE: No heat or cold intolerance, no increased sweating.   SKIN: No acne, rashes or lesions.  MUSCULOSKELETAL: No pain or swelling in the joints.  NEUROLOGICAL: No numbness, weakness, tremor or vertigo.  PSYCHIATRIC: No anxiety, insomnia, bipolar disorder.   PAST MEDICAL HISTORY: 1.  Chronic renal failure.  2.  COPD.   3.  Benign hypertension.  4.  History of CHF, ejection fraction less than 20% as of January 2015.   SOCIAL HISTORY: Denies smoking now, stopped for the last few months. Denies alcohol use. Lives with his nephew.   FAMILY HISTORY: Positive for diabetes, stroke and coronary artery disease.   HOME MEDICATIONS: Still need to be confirmed by pharmacy technician, but as per the records:  1.  Spiriva 18 mcg inhalation capsule once a day. 2.  Flomax 0.4 mg oral capsule once a day.  3.  Nitroglycerin 0.4 mg sublingual tablet as needed for chest pain.  4.  Isosorbide mononitrate 60 mg oral extended-release once a day.  5.  Furosemide 160 mg daily.  6.  Folic acid 1 mg once a day.  7.  Carvedilol 25 mg oral 2 times a day.  8.  Amiodarone 200 mg oral tablet once a day.  9.  Budesonide and formoterol inhalation 2 inhalations 2 times a day.   PHYSICAL EXAMINATION: VITAL SIGNS:  In ER, temperature 98.5, pulse rate 85, respirations 18, blood pressure 153/94, pulse ox 97% on oxygen supplementation 2 or 3 liters.  GENERAL: The patient is fully alert and oriented to time, place and person. Does not appear in any acute distress.  HEENT: Head and neck  atraumatic. Conjunctivae pink.  Oral mucosa moist.  NECK: Supple. No JVD.  RESPIRATORY: Bilateral equal air entry. Some crepitation present. No wheezing.  CARDIOVASCULAR: S1, S2 present, regular. No murmur.  ABDOMEN: Soft, nontender. Bowel sounds present. No organomegaly.  SKIN: No rashes.  LEGS: Bilateral severe edema up to mid thigh with pitting, mild calf tenderness and tenderness behind both knees.  NEUROLOGICAL: Power 4 out of 5. Moves all 4 limbs. Generalized weakness. No gross  abnormality.  PSYCHIATRIC: Does not appear in any acute psychiatric illness.  LABORATORY, DIAGNOSTIC AND RADIOLOGICAL DATA:  Glucose 90. BNP is 46,672. BUN 65, creatinine 3.58, sodium of 141, potassium 3.8, chloride 99, CO2 is 37, potassium is 8.8, total protein 7.4, bilirubin 0.4, alkaline phosphate 89, SGOT 22 and SGPT 16. Troponin is 0.11. WBC 4.4, hemoglobin is 9.1, platelet count is 221, MCV is 88. Chest x-ray, PA and lateral, is done which shows greater lung inflation with improved aeration of right lower lobe, bilateral interstitial opacity which could reflect interstitial edema.   ASSESSMENT AND PLAN: A 74 year old male with past medical history of chronic renal failure and severe systolic congestive heart failure, presented with worsening leg swelling, [facility], discharged 3 days ago after being treated with IV Lasix.  1.  Fluid overload, acute congestive heart failure exacerbation. Will treat him with IV Lasix. He is taking 160 mg oral daily so we will give 160 mg IV daily. Fluid restriction and in and out monitoring. Echocardiogram was done in the last admission in January 2015, ejection fraction less than 20%, so no need to repeat it.  2.  Fluid overload, chronic renal failure. Will call nephrology consult for further management. The patient was on hemodialysis at one time but he states that [facility] as his kidney started working a little again. With this high dose of Lasix there are high chances of worsening of kidney function so I would call nephrology consult for management of this issue.  3.  Hypertension. We will continue Coreg and he will be on IV Lasix.  4.  Chronic obstructive pulmonary disease. He is using home oxygen, chronic respiratory failure, 2 to 3 liters use. We will continue the same. Currently, there is no wheezing.  5.  Leg swelling with some pain in the calf and behind the knees. I would like to get a deep vein  thrombosis study to rule out any deep venous thromboses and he was recently in the hospital also last week. Meanwhile, we will give heparin subcutaneous to prevent any new deep venous thromboses.  6.  Prognosis is extremely poor overall because of kidney failure and heart failure. Would like to call palliative care consult to explain him about the disease process and the prognosis.   TOTAL TIME SPENT ON THIS ADMISSION: 50 minutes.    ____________________________ Hope PigeonVaibhavkumar G. Elisabeth PigeonVachhani, MD vgv:cs D: 11/25/2013 17:48:08 ET T: 11/25/2013 20:03:29 ET JOB#: [id number]  cc: Hope PigeonVaibhavkumar G. Elisabeth PigeonVachhani, MD, <Dictator> Altamese DillingVAIBHAVKUMAR Jovontae Banko MD ELECTRONICALLY SIGNED 11/27/2013 12:06

## 2014-11-23 NOTE — Discharge Summary (Signed)
PATIENT NAME:  Chris Howe, Chris Howe MR#:  [id number] DATE OF BIRTH:  12/03/1940  DATE OF ADMISSION:  05/05/2014 DATE OF DISCHARGE:  05/07/2014  PRESENTING COMPLAINT:  Chest pain and shortness of breath.   DISCHARGE DIAGNOSES:  1.  Acute on chronic mild congestive heart failure, improved.   2.  Chronic kidney disease stage IV.  3.  Hypertension.  4.  Chronic obstructive pulmonary disease. Saturations on room air are 90-95% on exertion and room air.   CODE STATUS: No code, DNR.    MEDICATIONS:  1. Imdur 60 mg extended release p.o. daily.  2. Carvedilol 25 mg b.i.d.  3. Aspirin 81 mg daily.  4. Temazepam 15 mg at bedtime as needed.  5. Hydralazine 100 mg t.i.d.  6. Gabapentin 300 mg 1 capsule p.o. daily at bedtime.  7. Metolazone 2.5 mg b.i.d.  8. Lasix 80 mg b.i.d.  9. Chlorpheniramine hydrocodone 8/10, 5 mL every 12 hours.   DISCHARGE DIET: Low-sodium.   [facility] UP: With [facility], [facility] neph[facility], and [facility] PCP in 1-2 weeks or earlier appointments.   CONSULTATIONS:  Nephrology consultation with Dr. Ronn MelenaKolloru, cardiology consultation with Dr. Mariah MillingGollan.    DISCHARGE LABORATORY DATA: Creatinine is 4.21, BUN is 78, sodium is 141, potassium is 3.0. Cardiac enzymes, troponins x 3 were negative.   HOSPITAL COURSE:  Chris Howe is a 74 year old African-American gentleman with multiple medical problems who comes in with shortness of breath and chest pain along with cough, was admitted with:   1.  Acute on chronic systolic congestive heart failure, has known EF of 20%. He was started initially on IV Lasix drip, which was changed to IV Lasix b.i.d. and changed to p.o. Lasix with metolazone at discharge. He had good urine output, creatinine remained stable at 4.21. His baseline is around 3.56. [facility].  2.  CKD stage IV, seen by Dr. Ronn MelenaKolloru, no indication for HD, baseline creatinine of 3.6 the patient is [facility].  3.  History  of ventricular tachycardia. Continue amiodarone.  4.  Chronic respiratory failure. The patient's breathing improved after diuresis, his saturations were 90%-95% on exertion and room air, did not require oxygen.  5.  Known right upper lung mass. Per son  the [facility] and was recommended to follow up.   CODE STATUS: No code, DNR.    The patient's overall hospital stay otherwise remained stable.   TIME SPENT: 40 minutes.    ____________________________ Wylie HailSona A. Allena KatzPatel, MD sap:bu D: 05/08/2014 13:33:38 ET T: 05/08/2014 20:33:40 ET JOB#: [id number]  cc: Zana Biancardi A. Allena KatzPatel, MD, <Dictator> Willow OraSONA A Tanicia Wolaver MD ELECTRONICALLY SIGNED 05/15/2014 12:29

## 2014-11-23 NOTE — Discharge Summary (Signed)
PATIENT NAME:  Chris Howe, Chris Howe MR#:  [id number] DATE OF BIRTH:  Dec 04, 1940  DATE OF ADMISSION:  08/04/2013 DATE OF DISCHARGE: 08/14/2013   PRIMARY CARE PHYSICIAN: Non-local.   The patient will be [facility] today after dialysis.  FINAL DIAGNOSES:  1.  Acute on chronic respiratory failure.  2.  Acute systolic congestive heart failure with cardiomyopathy, ejection fraction 20% to 25%.  3.  Acute renal failure on chronic kidney disease requiring dialysis here in the hospital.  4.  Influenza A positive, finished Tamiflu.  5.  Ventricular tachycardia, nonsustained.  6.  Pneumonia, finished full treatment course in the hospital.  7.  Hyperkalemia.  8.  Elevated troponin.  9.  Abdominal aortic aneurysm.  10. Hypertension.  11. Pulmonary nodule.  12. Debility and weakness.   MEDICATIONS ON DISCHARGE: Include nitroglycerin 0.4 mg sublingually every 5 minutes as needed for chest pain, Lasix 40 mg daily, Flomax 0.4 mg daily, Imdur 60 mg extended-release daily, amiodarone 200 mg daily, gabapentin 300 mg at bedtime, Nystatin 100,000 units per mL oral suspension 5 mL orally q.6 hours, Coreg 25 mg twice a day, Combivent Respimat 1 puff every 6 hours as needed for wheezing, Symbicort 2 inhalations twice a day, Spiriva 1 inhalation daily, Colace 100 mg twice a day, hydralazine 50 mg 4 times a day, folic acid 1 mg daily, Ensure clear 200 mL twice a day and oxygen 2 L nasal cannula.   DIET: Low-sodium diet. Ensure twice a day. Regular consistency.   ACTIVITY: As tolerated.   FOLLOWUP: With physical therapy. Nephrology, Dr. Cherylann Ratel to decide on dialysis or not. Follow up in 1 to 2 [facility]   HOSPITAL COURSE: The patient was admitted August 04, 2013 with shortness of breath and fever. He was admitted with influenza A positive, acute on chronic respiratory failure, hyperkalemia and elevated troponin. He was started on IV fluids, Tamiflu, empiric IV antibiotics  and oxygen supplementation. Specialist during the hospital course included Dr. Lady Gary of cardiology, Dr. Thedore Mins of nephrology, physical therapy, Dr. Orlie Dakin of hematology, Dr. Elouise Munroe of palliative care and Dr. Wyn Quaker of vascular surgery.   LABORATORY AND RADIOLOGICAL DATA DURING THE HOSPITAL COURSE: Included an EKG that showed normal sinus rhythm, left axis deviation and inferior infarct. White blood cell count 8.3, hemoglobin and hematocrit 11.2 and 34.8, platelet count 140. Glucose 93, BUN 82, creatinine 4.79, sodium 138, potassium 5.4, chloride 105, CO2 23, calcium 8.6. Liver function tests: ALT up at 155, AST up at 253, albumin low at 2.8. Troponin up at 0.2. Influenza A positive. Third troponin went up to 0.37. Next creatinine actually went up to 5.41. White count went up to 14.1. Echocardiogram showed ejection fraction less than 20%, dilated left and right atrium, moderate mitral valve regurgitation and moderately elevated pulmonary arterial systolic pressure. Chest x-ray showed patchy airspace disease right lower lung compatible with pneumonia. Stool for C. diff. was negative. Creatinine peaked on January 5 at 5.59. Platelets dropped down to 82 at the lowest value. Hepatitis B surface antigen was negative. Ultrasound of the kidney bilaterally showed echogenic mildly enlarged kidneys, multiple cysts, correlate for polycystic kidney disease. Labs upon discharge included a glucose of 111, BUN 23, creatinine 2.12, sodium 137, potassium 3.5, chloride 102, CO2 of 33. GFR 35. White blood cell count 4.7, hemoglobin and hematocrit 9.4 and 29.3, platelet count 176.   HOSPITAL COURSE PER PROBLEM LIST:  1.  For the acute on chronic respiratory failure, the  patient remained on oxygen the entire hospitalization. He does take oxygen at home, but wears it p.r.n.. His pulse ox on room air was 88%. He is on 2 L nasal cannula and breathing comfortably.  2.  For the patient's acute systolic congestive heart failure with  cardiomyopathy and EF of less than 20%. Difficult fluid management since the patient also had acute renal failure on chronic kidney disease. The patient finally agreed to dialysis and did have 4 dialysis during the hospital stay. Since we are watching the kidney function closely, I cannot send him out on an ACE inhibitor. I will restart Lasix as outpatient to manage fluid. He is on Coreg already. He is on Imdur and hydralazine.  3.  Acute renal failure on chronic kidney disease requiring dialysis here in the hospital. He did receive 4 dialysis sessions. Dr. Cherylann RatelLateef of nephrology did not declare him end-stage at this point. He wants to follow him further at an LTAC facility to determine whether or not we can get him off dialysis or not. I ordered a BMP on a daily basis to be done there ith results to go to Dr. Cherylann RatelLateef. Urine output should also be monitored. The patient does not want continued dialysis. Currently, the patient has a right groin line in. Dr. Cherylann RatelLateef will be the one to determine when that groin line comes out. The plan will probably be to hold dialysis at this point and watch the kidney function.  4.  Influenza A positive. The patient finished a course of Tamiflu here.  5.  V. tach, nonsustained. The patient was placed on amiodarone by cardiology.  6.  Pneumonia. The patient received an entire course of antibiotics here in the hospital.  7.  Hyperkalemia, this had improved with dialysis and improving kidney function.  8.  Elevated troponin, likely false positive with acute on chronic renal failure.  9.  AAA that was seen on CAT scan in January measuring 3.2 cm.  10. Hypertension on medications. Blood pressure stable.  11. Pulmonary nodule seen on previous CT scan.  12. Debility and weakness. [facility]   TIME SPENT ON DISCHARGE: 40 minutes.  ____________________________ Herschell Dimesichard J. Renae GlossWieting, MD rjw:aw D: 08/14/2013 12:28:49 ET T: 08/14/2013 12:40:10  ET JOB#: [id number]  cc: Herschell Dimesichard J. Renae GlossWieting, MD, <Dictator> Salley ScarletICHARD J Jiya Kissinger MD ELECTRONICALLY SIGNED 08/19/2013 14:28

## 2014-11-23 NOTE — H&P (Signed)
PATIENT NAME:  Chris Howe, Chris Howe MR#:  [id number] DATE OF BIRTH:  03-Jul-1941  DATE OF ADMISSION:  05/05/2014  PRIMARY CARE PHYSICIAN: At [facility].  CARDIOLOGIST: Antonieta Iba, MD  REFERRING EMERGENCY ROOM PHYSICIAN: Dierdre Highman, MD  CHIEF COMPLAINT: Chest tightness.   HISTORY OF PRESENTING ILLNESS: A 74 year old male with a past history of CVA, CHF, ejection fraction less than 20%, benign hypertension, COPD, chronic renal failure. He was in the Emergency Room 3 days ago, says that complaint is mainly chest and back pain, which is dull, pressure like, on and off and which is more with deep breathing. He has some cough with mucus production but no fever and he has chronic oxygen use, 2-3 liters at home, which did not get worse. So he came to the Emergency Room 2 days ago and was noted having no infection, mild pulmonary edema, slightly elevated BNP according to his baseline, so was sent home. As per him, the problem continued and so finally he called 911 again and came over here again after 2 days. Compared to 2 days ago, his BNP has gone further up and as his problem continued emergency doctor decided to admit him to hospital for his CHF.    REVIEW OF SYSTEMS: CONSTITUTIONAL: Negative for fever, fatigue, weakness, pain or weight loss.  EYES: No blurring, double vision, discharge or redness.  EARS, NOSE, THROAT: No tinnitus, ear pain or hearing loss.  RESPIRATORY: The patient has some cough with mucus production and mild shortness of breath, but at baseline, also uses 2 liters oxygen.  CARDIOVASCULAR: Has some chest pain, which is all over the chest and worse with deep breathing. No palpitations. Has on and off edema in his legs. Currently for the last 1 or 2 days, he did not notice much edema on the legs.  GASTROINTESTINAL: No nausea, vomiting, diarrhea, abdominal pain.  GENITOURINARY: No dysuria, hematuria, increased frequency.  ENDOCRINE: No heat or cold intolerance. No excessive  sweating.  SKIN: No acne, rashes or lesions.  MUSCULOSKELETAL: No pain or swelling in the joints.  NEUROLOGICAL: No numbness, weakness, tremor or vertigo.  PSYCHIATRIC: No anxiety, insomnia, bipolar disorder.   PAST MEDICAL HISTORY:  1.  Chronic renal failure.  2.  COPD.  3.  Benign hypertension.  4.  History of CHF, ejection fraction less than 20% in July 2015.   SOCIAL HISTORY: He was a smoker, smoking 2 packs of cigarettes every day for many years, but stopped currently a few months ago. Denies drinking alcohol or illegal drug use. He lives alone and able to do his day to day activities. He even drives on his own and does not require any support to walk.   FAMILY HISTORY: Positive for diabetes, stroke and coronary artery disease.   HOME MEDICATIONS:  1.  Temazepam 15 mg oral 1-2 capsule once a day as needed for sleep.  2.  Metolazone 2.5 mg oral 2 times a day.  3.  Isosorbide mononitrate once a day.  4.  Hydralazine 100 mg oral 3 times a day.  6.  Furosemide 80 mg oral 2 times a day.  He received for 7 days in the past.  8.  Carvedilol 25 mg 2 times a day.  9.  Aspirin 81 mg once a day   PHYSICAL EXAMINATION:  VITAL SIGNS: In ER, temperature 98.4, pulse 60, respirations 18, blood pressure 130/63, pulse oximetry is 94% on room air.  GENERAL: The patient is fully alert and oriented. He is  thin, does not appear in any acute distress and cooperative with history taking, using oxygen supplementation, 2-3 liters nasal cannula.  HEENT: Head and neck atraumatic. Conjunctivae pink. Oral mucosa moist.  NECK: Supple. No JVD.  RESPIRATORY: Bilateral equal and some crepitation present.  CARDIOVASCULAR: S1, S2 present, regular. No murmur.  ABDOMEN: Soft, nontender. Bowel sounds present. No organomegaly.  SKIN: No rashes.  EXTREMITIES: Legs: Mild edema around ankles, but as per the patient, this is much better than his usual edema.  NEUROLOGICAL: No tremor or rigidity. Follows commands. Power  5/5 in all 4 limbs.  PSYCHIATRIC: Does not appear in any acute psychiatric illness.  JOINTS: No swelling or tenderness.   IMPORTANT LABORATORY RESULTS: Glucose 105. BNP was 57,500 on 1st October, which is 85,400 today. BUN is 78, creatinine 4.21, sodium 140, potassium 3.5, chloride 104, CO2 is 27, magnesium 1.7, calcium is 8.6 and lipase is a 68. Total protein 7.9. Albumin 2.8, bilirubin 0.5, alkaline phosphate 111, SGOT 25 and SGPT is 13. Troponin is 0.04. WBC 4.6, hemoglobin is 9.9. Platelet count is 256,000. MCV is 84. Prothrombin 14.2. INR is 1.1. PTT is 31.9. Urinalysis is grossly negative.   IMAGING: Chest x-ray, portable, which was done 3 days ago, shows cardiomegaly with chronic interstitial thickening, mild pulmonary venous congestion, CT scan of the chest without contrast shows interval enlargement of right upper lobe pulmonary nodule consistent with progressive primary bronchogenic carcinoma. No evidence of pleurisy, adenopathy or metastatic disease. Advanced cardiomegaly with atherosclerosis including within LAD.   ASSESSMENT AND PLAN: A 74 year old male with past history of cerebrovascular accident, congestive heart failure, chronic obstructive pulmonary disease, benign hypertension, chronic renal failure, came to Emergency Room with some chest pain, which is worsening on and off.  1.  Acute systolic congestive heart failure, ejection fraction is less than 20% as per recent echocardiogram in the past. Because of his renal failure, I will put him on IV Lasix drip to prevent further damage and we will continue monitoring his input and output and we will get cardiology consult. Because of this terminal congestive heart failure, he also is a candidate for defibrillator evaluation and for ACE inhibitor and spironolactone. I asked the patient, he says Dr. Mariah MillingGollan offered him but he refused to get defibrillator in the past already. ACE inhibitor and spironolactone, I would not like to give because of  his renal function.  2.  Hypertension. Currently, we will continue carvedilol and hydralazine over here and continue monitoring his blood pressure. Also on IV Lasix drip.  3.  Chronic obstructive pulmonary disease with chronic respiratory failure, on 2-3 liters oxygen at home. Currently at baseline. No wheezing and requiring the same oxygen, so we will just continue monitoring  4.  Lung nodule with enlargement, as per CAT scan. This might be primary bronchogenic carcinoma depending on patient's overall poor health status and multiple comorbidities. I would [facility], but we need to send these reports over there to his PMD's office and will involve palliative care to discuss his goal of treatment for this. 5.  Chronic renal failure. Creatinine is in the baseline, around 4 with no electrolyte imbalance currently, but because of requirement of IV Lasix we will keep close monitoring and checking daily. Avoid nephrotoxic drugs.   CODE STATUS: DNR.   Previously in the last admission the patient was screened for hospice and sent home with hospice services. Today, because of that finding of lung nodule, he might qualify  again. We will wait for palliative care interventions.   TOTAL TIME SPENT: Fifty minutes.    ____________________________ Hope Pigeon Elisabeth Pigeon, MD vgv:TT D: 05/05/2014 14:46:00 ET T: 05/05/2014 16:01:24 ET JOB#: [id number]  cc: Hope Pigeon. Elisabeth Pigeon, MD, <Dictator> Antonieta Iba, MD Altamese Dilling MD ELECTRONICALLY SIGNED 05/06/2014 13:31

## 2014-11-23 NOTE — H&P (Signed)
PATIENT NAME:  Chris Howe, Chris Howe MR#:  [id number] DATE OF BIRTH:  1941/06/22  DATE OF ADMISSION:  08/04/2013  REFERRING PHYSICIAN: Darien Ramus, MD   FAMILY PHYSICIAN: Nonlocal.   REASON FOR ADMISSION: Shortness of breath with fever.   HISTORY OF PRESENT ILLNESS: The patient is a 74 year old male with a significant history of COPD and hypertension, who presents to the Emergency Room with 1- to 2-day history of worsening shortness of breath. In the Emergency Room, the patient was noted to have cough and fever. He tested flu positive. Was in the Emergency Room 1 to 2 days ago with hematuria, at which time he was noted to have a AAA. He is asymptomatic from this standpoint now. In the Emergency Room, the patient is ill-appearing and cachectic, with fever and shortness of breath and is now admitted for further evaluation. He is also noted to be dehydrated with acute on chronic renal failure.   PAST MEDICAL HISTORY:  1. Chronic renal failure.  2. COPD.  3. Benign hypertension.  4. History of CHF.   MEDICATIONS:  1. Nitrostat 0.4 mg sublingually p.r.n. chest pain.  2. Imdur 60 mg p.o. daily.  3. Combivent 1 puff q.i.d.  4. Symbicort 160/4.5 two puffs b.i.d. 5. Hydralazine 100 mg p.o. b.i.d.  6. Neurontin 300 mg p.o. at bedtime.  7. Lasix 40 mg p.o. daily.  8. Aspirin 325 mg p.o. daily.  9. Coreg 25 mg p.o. b.i.d.   ALLERGIES: No known drug allergies.   SOCIAL HISTORY: The patient continues to smoke. Denies alcohol abuse.   FAMILY HISTORY: Positive for diabetes, stroke and coronary artery disease.   REVIEW OF SYSTEMS:  CONSTITUTIONAL: The patient has had weight loss with fever.  EYES: No blurred or double vision. No glaucoma.  ENT: No tinnitus or hearing loss. No nasal discharge or bleeding. No difficulty swallowing.  RESPIRATORY: The patient has had cough and wheezing. Denies hemoptysis. No painful respiration.  CARDIOVASCULAR: No chest pain or orthopnea. No palpitations or  syncope.  GASTROINTESTINAL: Nausea, but no vomiting or diarrhea. No abdominal pain or change in bowel habits.  GENITOURINARY: Hematuria recently, but no dysuria. No incontinence.  ENDOCRINE: No polyuria or polydipsia. No heat or cold intolerance.  HEMATOLOGIC: The patient denies anemia, easy bruising or bleeding.  LYMPHATIC: No swollen glands.  MUSCULOSKELETAL: The patient has diffuse pain in his neck, back, shoulders, knees, hips. Denies gout.  NEUROLOGIC: No numbness or migraines. Denies stroke or seizures.  PSYCHIATRIC: The patient denies anxiety, insomnia or depression.   PHYSICAL EXAMINATION:  GENERAL: The patient is a cachectic, ill-appearing male in moderate distress.  VITAL SIGNS: Remarkable for a blood pressure of 139/88 with a heart rate of 86, respiratory rate of 28, temperature of 102.6.  HEENT: Normocephalic, atraumatic. Pupils equally round and reactive to light and accommodation. Extraocular movements are intact. Sclerae nonicteric. Conjunctivae are clear. Oropharynx is dry, but clear.  NECK: Supple without JVD. No adenopathy or thyromegaly is noted.  LUNGS: Reveal scattered rhonchi, without wheezes or rales. No dullness. Respiratory effort is normal.  CARDIAC: Regular rate and rhythm with normal S1 and S2. No significant rubs, murmurs or gallops. PMI is nondisplaced. Chest wall is nontender.  ABDOMEN: Soft, nontender, with normoactive bowel sounds. No organomegaly or masses were appreciated. No hernias or bruits were noted.  EXTREMITIES: Without clubbing, cyanosis or edema. Pulses were 2+ bilaterally.  SKIN: Warm and dry, without rash or lesions.  NEUROLOGIC: Revealed cranial nerves II through XII grossly intact. Deep tendon reflexes  were symmetric. Motor and sensory exam is nonfocal.  PSYCHIATRIC: Revealed a patient who was alert and oriented to person, place and time. He was cooperative and used good judgment.   LABORATORY DATA: EKG revealed sinus rhythm at 85 beats per  minute with lateral ST-T wave changes. Chest x-ray done yesterday revealed nodular density in the right upper lung which has been present now for several months. No acute infiltrate or edema was noted. CT of the abdomen and pelvis revealed a 3.2 cm AAA with enlarged prostate. Flu swab was positive for influenza A. White count was 8.3 with a hemoglobin of 11.2. Troponin was 0.20 with a glucose of 93, a BUN of 82, creatinine of 4.79 and a GFR of 13.   ASSESSMENT:  1. Influenza A positive.  2. Acute on chronic respiratory failure.  3. Hyperkalemia.  4. Elevated troponin.  5. Abdominal aortic aneurysm.  6. Benign prostatic hypertrophy.  7. Chronic obstructive pulmonary disease.   PLAN: The patient will be admitted to telemetry and continued on his isosorbide and hydralazine. Will follow serial cardiac enzymes and obtain an echocardiogram. Will also consult cardiology. In regards to his acute on chronic renal failure, will begin IV fluids and follow his renal status closely. Will obtain a nephrology consult. Will obtain a vascular consult because of his AAA. Will repeat a urinalysis now. Begin Flomax for his BPH. Will begin Tamiflu for his influenza and empiric IV antibiotics. Tylenol as needed for fever. Will optimize his respiratory medications. Supplement oxygen as needed. Will obtain consults from care management and physical therapy. Clear liquid diet for now. Follow up routine labs and a chest x-ray in the morning. Further treatment and evaluation will depend upon the patient's progress.   TOTAL TIME SPENT ON THIS PATIENT: 50 minutes.   ____________________________ Duane LopeJeffrey D. Judithann SheenSparks, MD jds:lb D: 08/04/2013 11:28:41 ET T: 08/04/2013 11:50:24 ET JOB#: [id number]  cc: Duane LopeJeffrey D. Judithann SheenSparks, MD, <Dictator> Nephi Savage Rodena Medin Keani Gotcher MD ELECTRONICALLY SIGNED 08/04/2013 13:45

## 2014-11-24 NOTE — H&P (Signed)
PATIENT NAME:  Chris Howe, Chris Howe MR#:  [id number] DATE OF BIRTH:  [birth year]  DATE OF ADMISSION:  02/08/2012  HISTORY OF PRESENT ILLNESS: Patient is a 74 year old African American male with past medical history significant for history of congestive heart failure, history of cardiomyopathy with ejection fraction of 15%, malignant hypertension, history of pulmonary hypertension, history of chronic obstructive pulmonary disease and non-STEMI and admission for the same in July 2012 presented to the hospital with complaints of inability to speak. According to patient he was doing well up until today in the morning when he went to the store and then came back in the morning. He tried to get to the house, however, could not open the door, key did not fit in door in the lock. He felt somewhat strange. He was not able to speak. He was not able to say anything and it lasted until he was brought here to Emergency Room further evaluation. It is unclear how long it lasted, could have been a few hours, could have been just a few minutes but patient stated that it actually happened in the morning and he was brought to the hospital at least at around 4:00 p.m. He denied any other symptoms such as vision problems or swallowing problems. Denied any weakness or tingling or numbness sensation in his upper or lower extremities. In the Emergency Room his speech is somewhat improved, however, he still had some repetitive words. Hospitalist service was contacted for admission. His CT of head was unremarkable and did not show any bleeding.   PAST MEDICAL HISTORY:  1. History of acute respiratory failure, admission for the same and it was felt to be congestive heart failure, acute on chronic, systolic. 2. Cardiomyopathy, ejection fraction of 15%. Echocardiogram done in July 2012 showed right and left ventricular function significantly impaired with moderately dilated ventricles, mild to moderate mitral regurgitation as well as tricuspid  regurgitation, right ventricular systolic pressures were elevated at 50 to 60 mmHg, moderate pulmonary hypertension, severe global hypokinesis of left ventricle. 3. Malignant hypertension. 4. History of pulmonary hypertension. 5. Chronic obstructive pulmonary disease. 6. Non-STEMI. 7. Chronic renal failure with creatinine of 2.5. 8. Anemia of chronic disease.  9. Hypokalemia.  10. 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  11. History of severe peripheral vascular disease. 12. Hypertension. 13. Tobacco abuse, up to 3 packs a day, quit according to medical records in 2012.  14. History of nonsustained ventricular tachycardia. 15. Restless leg syndrome.  16. Gout.   ALLERGIES: No known drug allergies.   MEDICATIONS:  1. K-Dur 10 mEq p.o. daily. 2. Lipitor 40 mg p.o. daily.  3. Imdur 15 mg p.o. daily.  4. Albuterol 2 puffs every two hours as needed.  5. Aspirin 81 mg p.o. daily.  6. Coreg 12.5 mg p.o. twice daily. 7. Colace 100 mg p.o. twice daily. 8. Lasix 40 mg p.o. daily.  9. Gabapentin 300 mg p.o. at bedtime.  10. Hydralazine 50 mg p.o. twice daily. 11. Lisinopril 10 mg p.o. daily.  12. Nitrostat 0.4 mg as needed.   SOCIAL HISTORY: Patient is divorced, has two adult kids. Patient has history of tobacco abuse, 3 packs a day and still smokes. In the past he quit but then restarted again. No history of alcohol or illicit drug abuse. He lives with his brother.   FAMILY HISTORY: Patient's mother died of cancer at age of 4.  Patient's father died also of cancer. Also family history positive for chronic kidney disease. Patient's mother as well as brother had chronic kidney failure they were on hemodialysis.   REVIEW OF SYSTEMS: CONSTITUTIONAL: Shortness of breath even at rest, one pillow orthopnea, some weight loss unclear how long, constipation. Denies any fevers, chills, fatigue, weakness,  pains or weight gain. EYES: In regards to eyes denies any blurred vision, glaucoma, cataracts. ENT: Denies any tinnitus, allergies, epistaxis, sinus pain, dentures, difficulty swallowing. RESPIRATORY: Denies cough, wheezes, hemoptysis, asthma, chronic obstructive pulmonary disease.  CARDIOVASCULAR: Denies chest pains, orthopnea, edema, arrhythmia, palpitations or syncope. GASTROINTESTINAL: Denies any nausea, vomiting, diarrhea. Admits of constipation. GENITOURINARY: Denies dysuria, hematuria, frequency, incontinence. ENDOCRINE: Denies any polydipsia, nocturia, thyroid problems, heat or cold intolerance or thirst. HEMATOLOGY: Denies any anemia, easy bruising, bleeding, swollen glands. SKIN: Denies any acne, rash, lesions, change in moles. MUSCULOSKELETAL: Denies arthritis, cramps, swelling, gout. NEURO: Denies numbness, epilepsy, tremor. PSYCH: Denies anxiety, insomnia, or depression.  PHYSICAL EXAMINATION: VITAL SIGNS: On arrival to the hospital patient's vitals: Temperature 96.8, pulse 78, respiration rate 20, blood pressure 191/121, saturation 98% on room air.   GENERAL: This is a well-developed, well-nourished thin even cachectic African American male in no significant distress, however, somewhat uncomfortable and short of breath sitting on the stretcher.   HEENT: His pupils are equal, reactive to light. Extraocular movements are intact. No icterus or conjunctivitis. Has normal hearing. No pharyngeal erythema. Mucosa is moist.   NECK: No masses, supple, nontender. Thyroid is not enlarged. No adenopathy. No JVD or bruits bilaterally. Full range of motion.   LUNGS: Diminished breath sounds bilaterally especially in upper lung fields. No rales, rhonchi, or wheezing. No labored inspirations, however, whenever patient moves patient did have some increased effort to breathe. Does not have any dullness to percussion. In mild respiratory distress.   CARDIOVASCULAR: S1, S2 appreciated. No murmurs, gallops,  rubs noted. PMI not lateralized. Chest is nontender to palpation.   EXTREMITIES: 1+ pedal pulses. Trace to 1+ lower extremity edema bilaterally. No calf tenderness or cyanosis was noted.   ABDOMEN: Soft, nontender. No hepatosplenomegaly or masses are noted.   RECTAL: Deferred.   MUSCULOSKELETAL: Able to move extremities. No cyanosis, degenerative joint disease, or kyphosis. Gait is not tested.   SKIN: Skin did not reveal any rashes, lesions, erythema, nodularity, induration. It was warm and dry to palpation.   LYMPH: No adenopathy in cervical region.   NEUROLOGICAL: Somewhat shallow facial crease on the left. Patient does have right Babinski which is minimal. He has expressive aphasia. He is alert, oriented to person, place, cooperative. Memory is somewhat impaired. No significant confusion, agitation, or depression noted. He does have mild tongue deviation to the left as well.   LABORATORY, DIAGNOSTIC AND RADIOLOGICAL DATA: Patient's EKG done today on 02/08/2012 showed normal sinus rhythm at 79 beats per minute, left axis deviation, possible left atrial enlargement, left ventricular hypertrophy with repolarization abnormality, T depressions in lateral leads. According to EKG criteria left ventricular hypertrophy repolarization abnormality. No acute ST-T changes were noted. As compared to prior EKG patient did have very similar presentation in the past as well Patient's labs done today on 07/09 showed glucose 16, BUN and creatinine were 34 and 2.69 otherwise BMP was unremarkable. Patient's liver enzymes showed albumin level of 2.6 and low protein level of 6.2. Troponin level 0.04. White blood cell count 4.3, hemoglobin 12.5, platelet count 174. Coagulation panel within normal limits. CT scan of head without contrast 02/08/2012 showed chronic  ischemic changes. No acute abnormalities.   ASSESSMENT AND PLAN:  1. CVA, questionable embolic. Admit patient to medical floor, telemetry. Start patient on  aspirin therapy at 325 mg daily. This was discussed with Dr. Sherryll Burger, neurologist. Will also get carotid ultrasound and echocardiogram. Will get MRI of brain to evaluate for new infarct.  2. Malignant hypertension. Will resume home medications. Will follow patient's blood pressure readings.   3. Chronic renal failure. Seemed to be stable. Will follow.  4. Cardiomyopathy. Will continue ACE inhibitor as well as Lasix and Coreg and Imdur. Seemed to be stable.  5. Chronic obstructive pulmonary disease. Continue oxygen therapy and nebulizers.  6. Ongoing tobacco abuse. This was discussed with patient for five minutes. Patient refused replacement therapy, however, when offered as needed nicotine inhaler, oral inhaler, he was afebrile to try it if needed.       TIME SPENT: 50 minutes.   ____________________________ Katharina Caper, MD rv:cms D: 02/08/2012 18:52:44 ET T: 02/09/2012 05:57:21 ET JOB#: [id number]  cc: Katharina Caper, MD, <Dictator>  Tymon Nemetz MD ELECTRONICALLY SIGNED 02/20/2012 18:38

## 2014-11-24 NOTE — Discharge Summary (Signed)
PATIENT NAME:  Chris Howe, Chris Howe MR#:  [id number] DATE OF BIRTH:  1941/01/24  DATE OF ADMISSION:  02/08/2012 DATE OF DISCHARGE:  02/09/2012  ADMISSION DIAGNOSIS: Transient ischemic attack versus stroke.    DISCHARGE DIAGNOSES:  1. Transient ischemic attack versus stroke with aphasia.  2. Malignant hypertension.  3. Chronic renal failure.  4. Cardiomyopathy, ejection fraction of 15%.   CONSULTS: Dr. Cherylann RatelLateef   LABORATORY, DIAGNOSTIC AND RADIOLOGICAL DATA:  Sodium 146, potassium 4.2, chloride 106, bicarbonate 33, BUN 35, creatinine 2.82, glucose 115, phosphorus is 3.4.  Troponin x3 are negative.  White blood cells 4.1, hemoglobin 13, hematocrit 39.9, platelets are 173.   HOSPITAL COURSE: The patient is a 74 year old male who presented with aphasia, to rule out a stroke.  For further details, please refer to the History and Physical.   1. Transient ischemic attack versus cerebrovascular accident: The patient refused all testing, including MRI, carotid Dopplers, echocardiogram. His symptoms all resolved. He was discharged on aspirin 325 mg daily. He also refused any kind of therapy, including speech and physical therapy; however, as mentioned, his symptoms have resolved.  2. Malignant hypertension: Which improved when we restarted his outpatient medications.  3. Chronic renal failure: I appreciate Nephrology's consult. Renal ultrasound was ordered, however, the patient refused this. He will hold the Lasix for a few days. He will need to have follow-up with his primary care physician.  4. Cardiomyopathy, ejection fraction of 15%. The patient will resume his outpatient medications.   DISCHARGE MEDICATIONS:  1. Coreg 12.5 b.i.d.  2. Lasix 40 mg daily. Do not take until 02/12/2012.  3. Lisinopril 20 mg, 1/2 tablet daily.  4. Hydralazine 50 mg t.i.d.  5. Imdur 30 mg, 1/2 tablet daily.  6. Gabapentin 300 mg at bedtime.  7. KCl 10 mEq daily.  8. Aspirin 325 mg daily.   DISCHARGE DIET: Low sodium.    DISCHARGE ACTIVITY: No exertion or heavy lifting.   DISCHARGE FOLLOW UP: Follow up with Dr. Fulton MoleHarriett Burns in 1 to 2 days.    TIME SPENT: Approximately 35 minutes.  ____________________________ Janyth ContesSital P. Juliene PinaMody, MD spm:cbb D: 02/09/2012 13:30:43 ET T: 02/10/2012 10:32:25 ET JOB#: [id number]  cc: Eldrick Penick P. Juliene PinaMody, MD, <Dictator> Hyman HopesHarriett P. Burns, MD Janyth ContesSITAL P Amaia Lavallie MD ELECTRONICALLY SIGNED 02/10/2012 13:25

## 2014-12-01 NOTE — H&P (Signed)
PATIENT NAME:  Chris Howe, Chris Howe MR#:  [id number] DATE OF BIRTH:  04-14-1941  DATE OF ADMISSION:  11/02/2014  REFERRING DOCTOR: Coolidge Breeze, MD   PRIMARY CARE PRACTITIONER: [facility].   ADMITTING DOCTOR: Crissie Figures, MD    CHIEF COMPLAINT:  1.  Generalized weakness.  2.  Chronic dizziness.   HISTORY OF PRESENT ILLNESS: A 74 year old African American male, on hospice care at home, on DNR status, with past medical history of hypertension, coronary artery disease, ischemic cardiomyopathy with ejection fraction of 15%, COPD, CKD stage IV, refused hemodialysis, thoracic aortic aneurysm,  pulmonary hypertension, active smoker presents to the Emergency Room with the complaints of increasing generalized weakness. The patient said that he has been feeling generalized weakness for which he came to the Emergency Room for further evaluation. The patient also complains of dizziness on and off, which has been there for the past many months.   In the Emergency Room, the patient was evaluated by the ED physician and was noted to have mildly elevated white blood cell count of 11.1 and a urinalysis significant for UTI and was also noted to have acute on CKD stage IV. Hence, hospitalist service was consulted for further evaluation and management. The urine was sent for culture and the patient was started on IV Levaquin. The patient was also noted to have a temperature of 99.8 degrees Fahrenheit on arrival but the vital signs are stable. The patient denies any chest pain. No shortness of breath. He does have some chronic cough but no increased cough or shortness of breath lately. No nausea, vomiting, diarrhea. No dysuria, frequency, urgency. No focal weakness or numbness.   PAST MEDICAL HISTORY:  1.  Hypertension.  2.  Coronary artery disease, refused cardiac catheterization in the past.  3.  Ischemic cardiomyopathy with ejection fraction of 15%. The patient under hospice care at home.  4.  COPD.    5.  CKD stage IV, refused hemodialysis.  6.  Thoracic aortic aneurysm.  7.  Pulmonary hypertension.   FAMILY HISTORY: Positive for diabetes, stroke, and coronary artery disease. Brothers with end-stage renal disease. Sisters, mother, and father with some kinds of cancer.   ALLERGIES: No known drug allergies.   SOCIAL HISTORY: Lives alone at home under hospice care at home and history of smoking about 2 packs of cigarettes per day for the past many years. Denies any alcohol or substance abuse.     SURGICAL HISTORY: No history of any surgeries in the past.   HOME MEDICATIONS:  1.  Aspirin 81 mg 1 tablet orally once a day.  2.  Bentyl 10 mg oral capsule, 2 capsules orally every 8 hours as needed for abdominal discomfort.  3.  Carvedilol 25 mg 1 tablet orally 2 times a day.  4.  Furosemide 40 mg 2 tablets orally 2 times a day.  5.  Gabapentin 300 mg oral capsule, 1 capsule orally at bedtime.  6.  Hydralazine 100 mg oral tablet, 1 tablet orally 3 times a day.  7.  Isosorbide mononitrate 30 mg oral tablet extended release, 3 tablets orally once a day in the morning.  8.  Spiriva 18 mcg inhalation capsule, 1 capsule orally once a day in the morning.  9.  Symbicort 160/4.5 mcg inhalation, 2 puffs inhalation 2 times a day.  10.  Temazepam 15 mg oral capsule, 1 capsule orally once a day at bedtime as needed for insomnia.   REVIEW OF SYSTEMS:   CONSTITUTIONAL: Negative for  fever, chills. Complains of generalized weakness with fatigue.  EYES: Negative for blurred vision, double vision. No pain. No redness. No discharge.  EARS, NOSE, AND THROAT: Negative for tinnitus, ear pain, hearing loss.   RESPIRATORY: Positive for chronic cough with shortness of breath but negative for any acute increase in the cough. No wheezing, no dyspnea, no hemoptysis, no painful respirations.  CARDIOVASCULAR: Negative for chest pain, palpitations, syncopal episodes, orthopnea, dyspnea on exertion.  GASTROINTESTINAL:  Negative for nausea, vomiting, diarrhea, constipation, abdominal pain. No history of melena.  GENITOURINARY: Negative for dysuria, frequency, urgency.  ENDOCRINE: Negative for polyuria, nocturia, heat or cold intolerance.  HEMATOLOGIC AND LYMPHATICS: Negative for easy bruising, bleeding, swollen glands.  INTEGUMENTARY: Negative for acne, skin rash, or lesions.  MUSCULOSKELETAL: Negative for arthritis, gout.  NEUROLOGICAL: Negative for focal weakness or numbness.  PSYCHIATRIC: Negative for anxiety and depression.   PHYSICAL EXAMINATION:  VITAL SIGNS: Temperature 99.8 degrees Fahrenheit, pulse rate 76 per minute,  respirations 16 per minute, blood pressure 145/73, O2 saturation is 94% on oxygen supplementation.  GENERAL: Well developed, well nourished, alert, in no acute distress, comfortably resting in the bed.  HEAD: Atraumatic, normocephalic.  EYES: Pupils equal, react to light and accommodation. No conjunctival pallor. No icterus.  NOSE: No drainage. No lesions.  EARS: No drainage. No external lesions.  ORAL CAVITY: No mucosal lesions. No exudates.  NECK: Supple. No JVD. No thyromegaly. No carotid bruit. Range of motion of neck normal.  RESPIRATORY: Good respiratory effort. Not using accessory muscles of respiration. Bilateral vesicular breath sounds and no rales or rhonchi.  CARDIOVASCULAR: S1, S2 regular. No murmurs, gallops, or clicks. Pulses equal at carotid, femoral, and pedal pulses and 1+ pedal edema.  GASTROINTESTINAL: Abdomen soft, mild soft distention. No tenderness. No hepatosplenomegaly. No masses. No rigidity, no guarding. Bowel sounds present and equal in all 4 quadrants.  GENITOURINARY: Deferred.  MUSCULOSKELETAL: No joint tenderness or effusion. Range of motion adequate.  SKIN: Inspection within normal limits.  LYMPHATIC: No cervical lymphadenopathy.  VASCULAR: Good dorsalis pedis and posterior tibial pulses.  NEUROLOGICAL: Alert, awake, and oriented x 3. Cranial nerves II  through XII grossly intact. No sensory deficit. Motor strength 5/5 in both upper and lower extremities. DTRs 2+ bilateral and symmetrical. Plantars downgoing.  PSYCHIATRIC: Alert, awake, and oriented x 3. Judgment, insight adequate. Memory and mood within normal limits.   ANCILLARY DATA:  LABORATORY DATA: Serum glucose 102, BUN 123, creatinine 7.43, serum sodium 140, potassium 4.9, chloride 103, bicarbonate 24, total calcium 8.8, troponin 0.14, WBC 11.1, hemoglobin 10.3, hematocrit 33.4, platelet count 139,000. Urinalysis: Cloudy, blood 1+, nitrite negative, leukocyte esterase 3+, RBC 42, WBC 224, bacteria 1+, WBC clumps present.   Chest x-ray: No evidence of acute cardiopulmonary disease.   EKG: Normal sinus rhythm with ventricular rate of 77 beats per minute, left axis deviation, nonspecific ST, T changes.   ASSESSMENT AND PLAN: A 74 year old [place] American male with a history of hypertension, ischemic cardiomyopathy with ejection fraction of 15%, coronary artery disease, has refused cardiac catheterization, chronic obstructive pulmonary disease, chronic kidney disease stage IV, refused hemodialysis, chronic dizziness, on hospice care at home, presents with the complaints of generalized weakness, found to have urinary tract infection and acute on chronic kidney disease stage IV.  1.  Urinary tract infection. Plan: Admit to medical floor. Urine culture and sensitivity. Continue Levaquin. Follow up cultures.  2. Acute on chronic kidney disease stage IV, refused hemodialysis in the past, still refusing hemodialysis. Plan: Gentle IV hydration.  Avoid nephrotoxic agents. Follow BMP. Nephrology consultation requested for further advice.  3.  Ischemic cardiomyopathy with ejection fraction of 15%, stable clinically. Continue home medications.  4.  Hypertension, stable on home medications. Continue same.  5.  Coronary artery disease. Refused cardiac catheterization in the past. No new symptoms. No chest  pain. EKG: No new changes. Troponin is chronically mild elevation likely secondary to be related to CKD stage IV. Plan: Monitor, continue home medications, we will cycle cardiac enzymes, continue beta blocker, aspirin, and statin.  6.  Chronic obstructive pulmonary disease, stable on home medications. Continue same.  7.  Deep vein thrombosis prophylaxis. Subcutaneous heparin.  8.  Gastrointestinal prophylaxis. Proton pump inhibitor.   CODE STATUS: DNR confirmed with the patient.   TIME SPENT: 55 minutes.   ADDENDUM: I was told by the RN that the nursing staff got approval from [facility]  authorities for admission for tonight but need to arrange [facility] since the [facility] belongs to the [facility] system.     ____________________________ Crissie FiguresEdavally N. Aevah Stansbery, MD enr:AT D: 11/02/2014 02:32:51 ET T: 11/02/2014 05:42:57 ET JOB#: [id number]  cc: Crissie FiguresEdavally N. Fauna Neuner, MD, <Dictator> [facility]  Debbe MountsEDAVALLY N Raybon Conard MD ELECTRONICALLY SIGNED 11/02/2014 12:54

## 2014-12-01 NOTE — H&P (Signed)
PATIENT NAME:  Jacquelyne BalintYELLOCK, Ghassan W MR#:  [id number] DATE OF BIRTH:  April 19, 1941  DATE OF ADMISSION:  08/07/2014  PRIMARY CARE PHYSICIAN:  [facility].    REQUESTING PHYSICIAN:  Emily FilbertJonathan E Williams, MD   CHIEF COMPLAINT: Dizziness, shortness of breath, fever and cough.   HISTORY OF PRESENT ILLNESS: The patient is a 74 year old male with a known history of COPD, CHF, who is being admitted for pneumonia. The patient was recently in the hospital from October 4, until October 6. The patient was feeling dizzy for the last day or so. This morning, he fell because of extreme dizziness. He has been also reporting some fever and cough associated with blood and sputum. When patient's son came home, the patient was struggling to breathe, and he called EMS. Patient was brought down to the Emergency Department. While in the ED, he was found to have pneumonia on chest x-ray, and he is being admitted for further evaluation and management. His room air oxygenation was 78%, documented in the Emergency Department.   PAST MEDICAL HISTORY: 1.  CKD stage IV.  2.  COPD.  3.  Hypertension.  4.  Severe cardiomyopathy with EF less than 20%, documented in July of 2015.   ALLERGIES: No known drug allergies.   MEDICATIONS:  At home:  1.  Carvedilol 25 mg p.o. b.i.d.  2.  Hydralazine 100 mg p.o. 3 times a day.  3.  Hydrocortisone topical apply topically to the affected area 3 times a day as needed.  4.  Indomethacin 50 mg p.o. every 8 hours as needed.  5.  Isosorbide mononitrate 30 mg 3 tablets p.o. daily.  6.  MiraLAX once daily as needed.  7.  Prednisone 50 mg p.o. daily.  8.  Senokot 1 tablet p.o. b.i.d. as needed.  9.  Symbicort 2 puffs inhaled twice a day.  10.  Tylenol 325 mg 4 tablets p.o. 3 times a day as needed.  11.  Tylenol with Codeine 1 tablet p.o. b.i.d.  12.  Vitamin D3 of 1000 international units 2 tablets p.o. daily.   SOCIAL HISTORY: Former smoker 2 packs cigarettes daily, recently stopped  since the last admission; denies drinking alcohol or illicit drug use, he lives alone.   FAMILY HISTORY: Positive for diabetes, stroke, and coronary disease.   REVIEW OF SYSTEMS:  CONSTITUTIONAL: Positive for fever, fatigue, weakness.  EYES: No blurry or double vision.  ENT: No tinnitus or ear pain.  RESPIRATORY: Positive for cough with hemoptysis, also positive for dyspnea and COPD.  CARDIOVASCULAR: No chest pain. No orthopnea. No edema; positive for dyspnea on exertion.  GASTROINTESTINAL: Positive for nausea. No vomiting or diarrhea.  GENITOURINARY: No dysuria or hematuria.  ENDOCRINE: No polyuria or nocturia.  HEMATOLOGY: Positive for anemia. No easy bruising or bleeding.  SKIN: No rash or lesion.  MUSCULOSKELETAL: No arthritis or muscle cramp.  NEUROLOGIC: No tingling, numbness, positive for weakness.  PSYCHIATRY: No history of anxiety or depression.   PHYSICAL EXAMINATION: VITAL SIGNS: Temperature 97.6, heart rate 82 per minute, respirations 26 per minute, blood pressure 109/85, he is saturating 79% on room air, and 96% on 2 liters oxygen via nasal cannula.  GENERAL: patient is a 74 year old male lying in the bed comfortably without any acute distress.  EYES: Pupils equal, round, reactive to light and accommodation, no scleral icterus, extraocular muscles intact.  HEENT: Head atraumatic, normocephalic. Oropharynx and nasopharynx clear.  NECK: Supple. No jugular venous distention. No thyroid enlargement or tenderness.  LUNGS: Decreased breath  sounds at bases, rhonchi throughout both lungs, right more than the left, occasional rales.  CARDIOVASCULAR: S1, S2 normal. No murmurs, rubs, or gallop.  ABDOMEN: Soft, nontender, nondistended. Bowel sounds present. No organomegaly or masses.  EXTREMITIES: No pedal edema, cyanosis or clubbing.  NEUROLOGIC: Cranial nerves II through XII intact. Muscle strength 4/5 in lower extremities, sensation intact.  PSYCHIATRIC: The patient is alert and  oriented x 3.  SKIN: No obvious rash or lesion or ulcer.  MUSCULOSKELETAL: No joint effusion or tenderness.   LABORATORY PANEL: Normal BMP, except BUN of 93, creatinine 4.91, blood sugar of 76, troponin of 0.14. CBC showed white count of 11.5, hemoglobin 11.6, hematocrit 38.4, platelets 149,000.   Chest x-ray in the Emergency Department showed increasing size of the right upper lobe nodule to 2.1 cm, from previous CT. This is worrisome for bronchogenic carcinoma. Patchy right sided  opacification reflecting pneumonia superimposed on underlying chronic scarring. Underlying metastatic disease cannot be excluded. Cardiomegaly noted.   EKG shows normal sinus rhythm, no major ST-T changes. He does have some T wave inversion in lateral leads, which was already there in his previous EKG.  IMPRESSION AND PLAN: 1.  Pneumonia. We will start him on broad-spectrum antibiotic, obtain blood and sputum culture and get blood gas just to see his respiratory status.  2.  Hemoptysis. We will get a pulmonary consultation, this likely is worrisome for underlying bronchogenic carcinoma with pneumonia. We will get a CT chest for further evaluation.  3.  Elevated troponin, likely due to supply demand ischemia. We will monitor with serial troponins, put him on telemetry.  4.  Chronic kidney disease stage IV, likely progressive to stage V. We will consult nephrology.  5.  Acute on chronic systolic heart failure with ejection fraction of 20%, reported on previous echocardiogram. We will monitor it for now. Consider cardiology consultation.   CODE STATUS: Full code, he was do not resuscitate on last admission, although he wants to revert this back to full code, at least based on my conversation in the Emergency Department. He would like to talk to his family before he changes this decision. He is agreeable for talking back with palliative care again.    Total time taking care of this patient was 55 minutes (critical care).      ____________________________ Ellamae Sia. Sherryll Burger, MD vss:nt D: 08/07/2014 20:37:44 ET T: 08/07/2014 21:19:12 ET JOB#: [id number]  cc: Aerionna Moravek S. Sherryll Burger, MD, <Dictator> [facility] Ellamae Sia [facility] MD ELECTRONICALLY SIGNED 08/15/2014 22:10

## 2014-12-01 NOTE — Discharge Summary (Signed)
PATIENT NAME:  Chris Howe, Chris Howe MR#:  [id number] DATE OF BIRTH:  August 24, 1940  DATE OF ADMISSION:  08/07/2014 DATE OF DISCHARGE:  08/11/2014  PRESENTING COMPLAINT:  Dizziness, shortness of breath, fever, and cough.   DISCHARGE DIAGNOSES:  1.  Acute on chronic obstructive pulmonary disease flare with multilobar pneumonia.  2.  Cardiomyopathy severe, ejection fraction 15%.  3.  Chronic kidney disease stage V.  4.  Saturations more than 92% on 2 liters nasal cannula.   CODE STATUS: No code, DNR.    MEDICATIONS:  1. Carvedilol 25 mg b.i.d.  2. Hydralazine 100 mg t.i.d.  3. Imdur 30 mg extended-release 3 pills once a day.  4. Tylenol with codeine 1 tablet b.i.d. as needed.  5. Senokot S 50/8.6 one tablet b.i.d. as needed.  6. Vitamin D3 1000 international units 2 tablets p.o. once a day.  7. Hydrocortisone 1% topical cream apply to affected area 3 times a day as needed.  8. Tylenol 325 three tablets 3 times a day as needed.  9. MiraLax 17 grams p.o. daily as needed.  10. Symbicort 160/4.5 two puffs b.i.d.  11. Prednisone taper.  12. Gabapentin 300 mg at bedtime.  13. Spiriva 18 mcg inhalation daily.  14. Augmentin 500 mg orally every 24 hours.  15. The patient advised to stop taking indomethacin.   DISCHARGE INSTRUCTIONS:   1.  Home health nurse with hospice has been arranged.  2.  2 liters nasal cannula oxygen.  3.  [facility]  4.  [facility]  LABORATORY DATA:   1.  Clostridium difficile is negative.  2.  White count is 5.4, H and H is 10.9 and 35.4.  3.  Creatinine is 6.6.   CONSULTATIONS:   1.  Nephrology consultation with Dr. Wynelle Link, Dr. Cherylann Ratel.  2.  Palliative care with Dr. Harvie Junior.   BRIEF SUMMARY OF HOSPITAL COURSE: Chris Howe is a 74 year old African-American gentleman with multiple medical problems, comes to the Emergency Room with:   1.  Acute respiratory failure with hypoxia. The patient was continued on oxygen. He did receive some  Lasix, however his creatinine started trending up, Lasix was discontinued. The patient was set up with home oxygen. He had bilateral pneumonia and acute on chronic congestive heart failure as well. The patient declined to start hemodialysis. He does understand the risk of not starting the hemodialysis.  2.  Multilobar pneumonia. Post blood cultures 1 out of 2 was gram-positive cocci likely contamination. Urine legionella was negative. The patient was initially on IV azithromycin and Rocephin which was changed to p.o. Augmentin.  3.  Hemoptysis with pulmonary nodule worrisome for underlying bronchogenic carcinoma. The patient was given some steroid, Spiriva and Advair. Dr. Belia Heman was consulted. The patient reports he has had a biopsy of the nodule scheduled at [place].  He was recommended to follow up his [place] pulmonologist.  4.  CKD stage V.  The patient did not want dialysis. Nephrology consultation was obtained. The patient's family did voice understanding for the patient's need for dialysis.  5.  Acute on chronic systolic congestive heart failure with mild exacerbation elevated. The patient received some Lasix initially, however discontinued, however will be backed off on Lasix given his creatinine trending up.  6.  Palliative care saw the patient and discussed with the family along with the patient. He has been arranged for hospice to follow up at home along with home oxygen has been arranged as well.   Hospital  stay otherwise remained stable. The patient does have a long term poor prognosis. He remained a no code, DNR.    TIME SPENT: 40 minutes.    ____________________________ Wylie HailSona A. Allena KatzPatel, MD sap:bu D: 08/15/2014 10:23:25 ET T: 08/15/2014 14:47:49 ET JOB#: [id number]  cc: Sheriff Rodenberg A. Allena KatzPatel, MD, <Dictator> Laverda SorensonSarath C. Kolluru, MD Munsoor Lizabeth LeydenN. Lateef, MD Ned GraceNancy Phifer, MD  Willow OraSONA A Taletha Twiford MD ELECTRONICALLY SIGNED 08/16/2014 12:17

## 2014-12-01 NOTE — Consult Note (Signed)
General Aspect [place] Cardiologist: Dr. Rockey Situ, MD ______________  74 year old male with history of ischemic cardiomyopathy with an EF of <20% on echo 01/2014, CKD stage IV, COPD, long history of tobacco abuse since age 70 giving him a pack year history of >50 years, previous admissions to [facility] for NSTEMI at which time he refused cardiac cath, recent admission to [facility] in October for acute on chronic CHF who presented to [facility] on 08/07/2014 with post obstructive multilobar PNA, hemoptysis, and elevated troponin.  _____________  PMH: 1) Ischemic cardiomyopathy with an EF <20% on echo 01/2014 2) CKD stage IV 3) COPD  4) HTN 5) H/o stroke _______________   Present Illness 74 year old male with the above problem list who was recently admitted to [facility] in October with acute on chronic systolic CHF. CXR at that time showe cardiomegaly with chronic interstitial thickening.  Cannot exclude mild pulmonary venous congestion. 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 Consider further evaluation with chest CT. 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 It also showed Advanced cardiomegaly with atherosclerosis, including within the LAD. See report for full details of remaining report. ProBNP found to be at baseline in the 50000's, [place] [address]. SCr at baseline at 4.21. Started initially on Lasix gtt 2/2 renal functiona and was ultimately discharged on Lasix and metolazone. He has only been taking the Lasix 80 mg bid.    Patient with a long history of smoking (began at age 19 with smoking 2 PPD x 45 years, quit smoking 5 months ago), ICM echo 02/21/14 with EF <20%, pseudonormal pattern of LV diastolic filling, moderately enlarged RV, severely dilated left atrium, moderately dilated right atrium, moderate to severe mitral valve regurgitation, moderate to severe tricuspid regurgitation, mildly elevated pulmonary artery systolic pressure, mildly  increased left ventricular posterior wall thickness. He has previously refused cardiac cath during prior admission to [facility] for NSTEMI. Last evaluated in our outpatient clinic 09/2012. Stress testing 2013 with no ischemia, right ventricle moderately dilated, but trickier systolic function moderately reduced, left atrium moderately dilated, mild to moderate MR, moderate TR, right ventricular systolic pressure estimated at 50-60 mmHg. He was continued on his current medications at that time which included Lasix 40 mg daily, Coreg 25 mg bid, Lipitor 40 mg, ASA 325 mg, Plavix 75 mg, Imdur 30 mg, lisinopril 20 mg, and SL NTG 0.4 mg prn.   States he is taking his Lasix as 80 mg BID at home. States this productive cough of green sputum has been present for years and has not changed. He denies any LEE. He is Rx'd to wear 2 L via [place] at home but wears this about 50% of the time. The other 50% of the time he wears 3 L or is on RA because he does not feel like he needs any O2 on. He sleep with 1 pillow and with the bed flat. He denies any early satiety, or PND. He has good stamina at home. He does not want an AICD or cardiac cath at this time.  He presented to [facility] on 1/6 with reported dizziness, though the patient very much so denies this upon me asking him. He reports he had to use the restroom quite bad and was not going to make it in time and tripped over his oxygen cord and fell. He does report feeling somewhat fatigued over the past several days leading up to his presentation however. He cannot tell me more than  that. No fever or chills. No chest pain, palpitations, presyncope, or syncope. Up and down appetite.   Upon his arrival to [facility] he was found to have a post obstructive multilobular PNA, hemoptysis, and troponin 0.14-->0.21-->0.25 in the setting of CKD stage IV. He was started on ABX per IM. Diuretics were held per renal 2/2 his worsening renal function SCr 5.30 (baseline 4.21). He currently states he wants to  sleep but wakes up to speak with me. He states he is breathing well.   Physical Exam:  GEN no acute distress, cachectic, thin   HEENT hearing intact to voice, moist oral mucosa   NECK supple   RESP normal resp effort  decreased breath sounds throughout with bilateral rhonchi reight greater than left   CARD Regular rate and rhythm  Murmur   Murmur Systolic   ABD denies tenderness  soft   LYMPH negative neck   EXTR negative edema   SKIN normal to palpation   NEURO cranial nerves intact   PSYCH alert   Review of Systems:  Subjective/Chief Complaint SOB, cough/mild, fell today   General: Fatigue  Weakness   Skin: No Complaints   ENT: No Complaints   Eyes: No Complaints   Neck: No Complaints   Respiratory: Frequent cough  Hemopytsis  Short of breath  Sputum  green sputum   Cardiovascular: Dyspnea   Gastrointestinal: No Complaints   Genitourinary: No Complaints   Vascular: No Complaints   Musculoskeletal: No Complaints   Neurologic: Dizzness   Hematologic: No Complaints   Endocrine: No Complaints   Psychiatric: No Complaints   Review of Systems: All other systems were reviewed and found to be negative   Medications/Allergies Reviewed Medications/Allergies reviewed   Family & Social History:  Family and Social History:  Family History Coronary Artery Disease  Diabetes Mellitus  Stroke   Social History positive  tobacco, negative ETOH, negative Illicit drugs   + Tobacco Current (within 1 year)  former heavy smoker of 90 pack years, quit 11/2013, start age 32   Place of Living Home     COPD:    Renal Failure:    CHF:    HTN:    Denies:          Admit Diagnosis:   PNEUMONIA: Onset Date: 08-Aug-2014, Status: Active, Description: PNEUMONIA  Home Medications: Medication Instructions Status  predniSONE 50 mg oral tablet 1 tab(s) orally once a day Active  carvedilol 25 mg oral tablet 1 tab(s) orally 2 times a day Active  indomethacin 50  mg oral capsule 1 cap(s) orally every 8 hours, As Needed - for Pain Active  isosorbide mononitrate 30 mg oral tablet, extended release 3 tab(s) orally once a day Active  Tylenol with Codeine #3 300 mg-30 mg oral tablet 1 tab(s) orally 2 times a day, As Needed - for Pain Active  Senokot S 50 mg-8.6 mg oral tablet 1 tab(s) orally 2 times a day, As Needed - for Constipation Active  Vitamin D3 1000 intl units oral tablet 2 tab(s) orally once a day Active  hydrocortisone topical 1% topical cream Apply topically to affected area 3 times a day, As Needed - for Itching Active  Tylenol 325 mg oral tablet 3 tab(s) orally 3 times a day, As Needed - for Pain, for Fever  Active  polyethylene glycol 3350 - oral powder for reconstitution 17 gram(s) orally once a day, As Needed - for Constipation Active  Symbicort 160 mcg-4.5 mcg/inh inhalation aerosol 2 puff(s) inhaled 2 times  a day Active  hydrALAZINE 100 mg oral tablet 1 tab(s) orally 3 times a day Active   Lab Results:  Routine Micro:  06-Jan-16 17:59   Micro Text Report BLOOD CULTURE   COMMENT                   NO GROWTH IN 8-12 HOURS   ANTIBIOTIC                       Micro Text Report BLOOD CULTURE   COMMENT                   NO GROWTH IN 8-12 HOURS   ANTIBIOTIC                       Culture Comment NO GROWTH IN 8-12 HOURS  Result(s) reported on 08 Aug 2014 at 02:00AM.  Culture Comment NO GROWTH IN 8-12 HOURS  Result(s) reported on 08 Aug 2014 at 02:00AM.  Routine Chem:  07-Jan-16 01:42   Result Comment TROPONIN - RESULTS VERIFIED BY REPEAT TESTING.  - PREVIOUS CALLED TO KELLY GIBSON AT 1905  - ON 08-07-14 BY SMG/EKM.  Result(s) reported on 08 Aug 2014 at 03:00AM.  Glucose, Serum  108  BUN  90  Creatinine (comp)  5.30  Sodium, Serum 139  Potassium, Serum 4.8  Chloride, Serum 106  CO2, Serum 22  Calcium (Total), Serum 8.6  Anion Gap 11  Osmolality (calc) 306  eGFR (African American)  14  eGFR (Non-African American)  11 (eGFR values  <76m/min/1.73 m2 may be an indication of chronic kidney disease (CKD). Calculated eGFR, using the MRDR Study equation, is useful in  patients with stable renal function. The eGFR calculation will not be reliable in acutely ill patients when serum creatinine is changing rapidly. It is not useful in patients on dialysis. The eGFR calculation may not be applicable to patients at the low and high extremes of body sizes, pregnant women, and vegetarians.)  Cardiac:  06-Jan-16 17:59   Troponin I  0.14 (0.00-0.05 0.05 ng/mL or less: NEGATIVE  Repeat testing in 3-6 hrs  if clinically indicated. >0.05 ng/mL: POTENTIAL  MYOCARDIAL INJURY. Repeat  testing in 3-6 hrs if  clinically indicated. NOTE: An increase or decrease  of 30% or more on serial  testing suggests a  clinically important change)    22:10   Troponin I  0.21 (0.00-0.05 0.05 ng/mL or less: NEGATIVE  Repeat testing in 3-6 hrs  if clinically indicated. >0.05 ng/mL: POTENTIAL  MYOCARDIAL INJURY. Repeat  testing in 3-6 hrs if  clinically indicated. NOTE: An increase or decrease  of 30% or more on serial  testing suggests a  clinically important change)  07-Jan-16 01:42   Troponin I  0.25 (0.00-0.05 0.05 ng/mL or less: NEGATIVE  Repeat testing in 3-6 hrs  if clinically indicated. >0.05 ng/mL: POTENTIAL  MYOCARDIAL INJURY. Repeat  testing in 3-6 hrs if  clinically indicated. NOTE: An increase or decrease  of 30% or more on serial  testing suggests a  clinically important change)  Routine Hem:  06-Jan-16 17:59   WBC (CBC)  11.5  07-Jan-16 01:42   WBC (CBC)  14.6  RBC (CBC)  4.15  Hemoglobin (CBC)  11.1  Hematocrit (CBC)  36.2  Platelet Count (CBC)  133  MCV 87  MCH 26.9  MCHC  30.8  RDW  19.5  Neutrophil % 86.4  Lymphocyte % 8.4  Monocyte %  5.0  Eosinophil % 0.0  Basophil % 0.2  Neutrophil #  12.7  Lymphocyte # 1.2  Monocyte # 0.7  Eosinophil # 0.0  Basophil # 0.0 (Result(s) reported on 08 Aug 2014  at 02:20AM.)   EKG:  EKG Interp. by me   Interpretation EKG shows NSR, 78 bpm, left axis deviation, lateral TWI (old)   Radiology Results: XRay:    06-Jan-16 18:15, Chest Portable Single View  Chest Portable Single View   REASON FOR EXAM:    dyspnea  COMMENTS:       PROCEDURE: DXR - DXR PORTABLE CHEST SINGLE VIEW  - Aug 07 2014  6:15PM     CLINICAL DATA:  Dyspnea.  Initial encounter.    EXAM:  PORTABLE CHEST - 1 VIEW    COMPARISON:  Chest radiograph and CT of the chest performed  05/02/2014    FINDINGS:  On the prior CT, the patient had a 2.1 cm right upper lobe nodule,  which had increased in size from 2014, compatible with bronchogenic  carcinoma. Would follow-up with the patient as to whether there has  been interval diagnosis or treatment.    Patchy right-sided airspace opacification reflects pneumonia,  superimposed on the patient's underlying chronic scarring. Mild left  basilar opacity could also reflect pneumonia. No pleural effusion or  pneumothorax is seen.    The cardiomediastinal silhouette is enlarged. No acute osseous  abnormalities are identified.     IMPRESSION:  1. On the prior CT from October, the patient had a 2.1 cm right  upper lobe nodule, which had increased in size from 2014, compatible  with bronchogenic carcinoma. Would follow-up with the patient as to  whether there has been interval diagnosis or treatment.  2. Patchy right-sided airspace opacification reflects pneumonia,  superimposed on underlying chronic scarring. Mild left basilar  airspace opacity could also reflect pneumonia. Underlying metastatic  disease cannot be excluded on the basis of radiograph.  3. Cardiomegaly noted.    These results were called by telephone at the time of interpretation  on 08/07/2014 at 6:20 pm to Dr. Lenise Arena, who verbally  acknowledged these results.      Electronically Signed    By: Garald Balding M.D.    On: 08/07/2014 18:21          Verified By: JEFFREY . Radene Knee, M.D.,  CT:    06-Jan-16 20:55, CT Chest Without Contrast  CT Chest Without Contrast   REASON FOR EXAM:    hemoptysis  COMMENTS:       PROCEDURE: CT  - CT CHEST WITHOUT CONTRAST  - Aug 07 2014  8:55PM     CLINICAL DATA:  Fall. Dizziness. Shortness of breath. Congestion.  Pulmonary nodule posteriorly in the right upper lobe.    EXAM:  CT CHEST WITHOUT CONTRAST    TECHNIQUE:  Multidetector CT imaging of the chest was performed following the  standard protocol without IV contrast..  COMPARISON:  08/07/2014; 05/02/2014    FINDINGS:  Atherosclerotic calcification of the thoracic aorta and coronary  arteries. Possible focal aneurysmal dilatation of the aortic arch  along the AP window. Moderate cardiomegaly. Small right pleural  effusion with passive atelectasis. Polycystic kidneys. Perihepatic  ascites with suspected mesenteric edema in the upper abdomen.  Perisplenic ascites.    The previous right upper lobe nodule is not obscured by surrounding  airspace opacity. Accordingly, I can' t obtain an accurate new  measurement. There is also airspace opacity throughout much of the  right upper lobe inferiorly causing consolidation against the  background appearance of severe emphysema. Airspace opacity is  present in the right middle lobe and to a much lesser extent in the  lingula and right lower lobe. No visible rib destructionor  compelling findings of chest wall invasion along the nodule.     IMPRESSION:  1. The right upper lobe pulmonary nodule is surrounded by airspace  opacity. The consolidation extends through much of the lower portion  of the right upper lobe, also with some airspace opacity in the  right middle lobe and to a much lesser extent in the lingula and  right lower lobe. Multilobar pneumonia is favored ; certainly a  component of this could be due to radiation pneumonitis if the  patient has been receiving radiation  therapy. Asymmetric edema is  considered less likely given the degree of asymmetry.  2. Small right pleural effusion, nonspecific for malignant or  transudative versus exudative effusion.  3. Severe emphysema.  4. Considerable cardiomegaly and atherosclerosis. 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  5. Upper abdominal ascites.  6. Polycystic kidneys.      Electronically Signed    By: Sherryl Barters M.D.    On: 08/07/2014 21:12         Verified By: Carron Curie, M.D.,    No Known Allergies:   Vital Signs/Nurse's Notes: **Vital Signs.:   07-Jan-16 11:57  Vital Signs Type Routine  Temperature Temperature (F) 97.4  Celsius 36.3  Temperature Source oral  Pulse Pulse 79  Respirations Respirations 20  Systolic BP Systolic BP 607  Diastolic BP (mmHg) Diastolic BP (mmHg) 88  Mean BP 106  Pulse Ox % Pulse Ox % 100  Pulse Ox Activity Level  At rest  Oxygen Delivery 4L; Nasal Cannula    Impression 74 year old male with history of ischemic cardiomyopathy with an EF of <20% on echo 01/2014, CKD stage IV, COPD, long history of tobacco abuse since age 72 giving him a pack year history of >50 years, previous admissions to [facility] for NSTEMI at which time he refused cardiac cath, recent admission to [facility] in October for acute on chronic CHF who presented to [facility] on 08/07/2014 with post obstructive multilobar PNA, hemoptysis, and elevated troponin.   1. Elevated troponin: -Likely supply demand ischemia in the setting of his postobstructive multilobar PNA with worsening leukocytosis and poor clearance in the setting of acute on chronic CKD stage IV -Patient has declined cardiac cath in the past and continues to do so today, he is aware this limits is ischemic evaluation, he is also aware cardiac cath would place him into dialysis and he does not want this for himself  -Manage medically in the setting of his possible bronchogenic  carcinoma  2. Acute on chronic respiratory failure with hypoxia: -2/2 the above postobstructive multilobar PNA (blood cultures negative) and COPD -Continue supplimental 02 - ABG improving, maintaning sats on 4L 02 -ABX and inhalers per IM  3. Ischemic cardiomyopathy: -EF <20% on 01/2014 -Coreg 25 mg bid -Imdur 30 mg daily -Hydralazine 50 mg tid  4. CKD stage IV: -Renal on board -Has held diuretics, should he become wet diuresis may benefit his SCr  5. Hemoptysis: -Overall poor prognosis -Pulmonary on board  6. Dispo: -Appreciate Palliative Care help   Electronic Signatures: Rise Mu (PA-C)  (Signed 07-Jan-16 16:58)  Authored: General Aspect/Present Illness, History and Physical Exam, Review of  System, Family & Social History, Past Medical History, Home Medications, Labs, EKG , Radiology, Allergies, Vital Signs/Nurse's Notes, Impression/Plan Ida Rogue (MD)  (Signed 07-Jan-16 18:47)  Authored: General Aspect/Present Illness, History and Physical Exam, Review of System, Family & Social History, Health Issues, EKG , Impression/Plan  Co-Signer: General Aspect/Present Illness, History and Physical Exam, Review of System, Family & Social History, Past Medical History, Home Medications, Labs, EKG , Radiology, Allergies, Vital Signs/Nurse's Notes, Impression/Plan   Last Updated: 07-Jan-16 18:47 by Ida Rogue (MD)

## 2014-12-01 NOTE — Discharge Summary (Signed)
PATIENT NAME:  Chris Howe, Chris Howe MR#:  [id number] DATE OF BIRTH:  Feb 02, 1941  DATE OF ADMISSION:  11/02/2014 DATE OF DISCHARGE:  11/02/2014   ADMISSION DIAGNOSES:  1. Acute on chronic renal failure.  2. Urinary tract infection.   DISCHARGE DIAGNOSES: 1. Acute on chronic renal failure, refusing dialysis.  2. Urinary tract infection.  3. Cardiomyopathy, ejection fraction of 15%.  4. Elevated troponin from demand ischemia.   DISCHARGE PHYSICAL EXAMINATION: VITAL SIGNS: The patient is afebrile temperature 98.2, pulse 63, respirations 18, blood pressure 125/78, 96% on 2 liters.  GENERAL: The patient is alert, oriented, not in acute distress.  HEENT: Head is atraumatic. Pupils are round. Sclerae anicteric. Mucous membranes are dry.  CARDIOVASCULAR: Regular rate and rhythm. There is a 2/6 systolic ejection murmur heard best at the left sternal border. PMI is  laterally displaced.  LUNGS: Clear to auscultation without crackles, rales, rhonchi, or wheezing.  ABDOMEN: Bowel sounds are positive, hypoactive, no guarding or rebound. EXTREMITIES: No clubbing, cyanosis, or edema.   HOSPITAL COURSE: An 74 year old male who was admitted on April 2, with a history ischemic cardiomyopathy, ejection fraction of 15%, chronic obstructive pulmonary disease, chronic kidney disease stage IV refused hemodialysis in the past, on hospice care at home, who presented with generalized weakness, found to have a urinary tract infection and acute and chronic kidney disease. For further details, please refer to the H and P.  1. Acute on chronic kidney disease. The patient has refused hemodialysis. He continues to refuse hemodialysis, and nephrology has been consulted.  2. Urinary tract infection. The patient was started on antibiotics. Cultures are pending.  3.  Ischemic cardiomyopathy, ejection fraction of 15%, stable clinically.  4. Elevated troponin from demand ischemia. These troponins are flat.  5. Essential  hypertension. The patient will continue his outpatient medications.  6. History of coronary artery disease. The patient refused cardiac catheterization in the past. No new symptoms.  7. History of chronic obstructive pulmonary disease, which was stable.  8. [facility]. As per case management, plan of care is to transfer the [facility] to the [facility].   DISCHARGE MEDICATIONS: 1. Coreg 25 b.i.d.  2. Hydralazine 100 t.i.d.  3. Symbicort 2 puffs b.i.d.  4. Gabapentin 300 mg at bedtime.  5. Imdur 30 mg 3 tablets a.m.  6. Temazepam 15 mg p.r.n. inability to sleep.  7. Aspirin 81 mg daily.  8. Spiriva 18 mcg daily.  9. Bentyl 10 mg 2 tablets q. 8 hours p.r.n.  10. Rocephin 1 gram IV q. 24 hours.   DISCHARGE DIET: Low-sodium renal diet.   DISCHARGE ACTIVITY: As tolerated.   DISCHARGE REFERRAL: The patient is to be transferred  to [facility] when bed is available.   CODE STATUS: The patient is DO NOT RESUSCITATE status, with hospice care at home.    TIME SPENT: Approximately 45 minutes.     ____________________________ Janyth ContesSital P. Juliene PinaMody, MD spm:mw D: 11/02/2014 10:58:57 ET T: 11/02/2014 11:40:52 ET JOB#: [id number]  cc: Fatema Rabe P. Juliene PinaMody, MD, <Dictator> Janyth ContesSITAL P Lindzee Gouge MD ELECTRONICALLY SIGNED 11/02/2014 14:45

## 2014-12-01 NOTE — Consult Note (Signed)
   Comments   I called and spoke with pt's daughter. Updated her. She plans to come tomorrow to see him. Daughter says that patient has been declining and recognizes that he may be approaching end of life. She says he has wavered in the past on dialysis but will respect his decision. She also says he should be a DNR. She would like to involve hospice in his care and we talked about option of home with hospice vs the hospice home. Will order a hospice screening.  Electronic Signatures: Borders, Daryl EasternJoshua R (NP)  (Signed 08-Jan-16 13:49)  Authored: Palliative Care Phifer, Harriett SineNancy (MD)  (Signed 08-Jan-16 17:31)  Authored: Palliative Care   Last Updated: 08-Jan-16 17:31 by Phifer, Harriett SineNancy (MD)

## 2014-12-08 ENCOUNTER — Inpatient Hospital Stay
Admission: EM | Admit: 2014-12-08 | Discharge: 2014-12-10 | DRG: 640 | Disposition: A | Discharge status: Hospice | Attending: Internal Medicine | Admitting: Internal Medicine

## 2014-12-08 ENCOUNTER — Encounter: Payer: Self-pay | Admitting: Emergency Medicine

## 2014-12-08 DIAGNOSIS — Z66 Do not resuscitate: Secondary | ICD-10-CM | POA: Diagnosis present

## 2014-12-08 DIAGNOSIS — I120 Hypertensive chronic kidney disease with stage 5 chronic kidney disease or end stage renal disease: Secondary | ICD-10-CM | POA: Diagnosis present

## 2014-12-08 DIAGNOSIS — J449 Chronic obstructive pulmonary disease, unspecified: Secondary | ICD-10-CM | POA: Diagnosis present

## 2014-12-08 DIAGNOSIS — Z8673 Personal history of transient ischemic attack (TIA), and cerebral infarction without residual deficits: Secondary | ICD-10-CM | POA: Diagnosis not present

## 2014-12-08 DIAGNOSIS — N186 End stage renal disease: Secondary | ICD-10-CM | POA: Diagnosis present

## 2014-12-08 DIAGNOSIS — N179 Acute kidney failure, unspecified: Secondary | ICD-10-CM | POA: Diagnosis present

## 2014-12-08 DIAGNOSIS — Z888 Allergy status to other drugs, medicaments and biological substances status: Secondary | ICD-10-CM | POA: Diagnosis not present

## 2014-12-08 DIAGNOSIS — Z7982 Long term (current) use of aspirin: Secondary | ICD-10-CM

## 2014-12-08 DIAGNOSIS — Z7401 Bed confinement status: Secondary | ICD-10-CM | POA: Diagnosis not present

## 2014-12-08 DIAGNOSIS — Z515 Encounter for palliative care: Secondary | ICD-10-CM | POA: Diagnosis not present

## 2014-12-08 DIAGNOSIS — Z87891 Personal history of nicotine dependence: Secondary | ICD-10-CM | POA: Diagnosis not present

## 2014-12-08 DIAGNOSIS — E875 Hyperkalemia: Principal | ICD-10-CM | POA: Diagnosis present

## 2014-12-08 DIAGNOSIS — R609 Edema, unspecified: Secondary | ICD-10-CM | POA: Diagnosis not present

## 2014-12-08 DIAGNOSIS — R531 Weakness: Secondary | ICD-10-CM | POA: Diagnosis not present

## 2014-12-08 DIAGNOSIS — I5022 Chronic systolic (congestive) heart failure: Secondary | ICD-10-CM | POA: Diagnosis present

## 2014-12-08 DIAGNOSIS — A047 Enterocolitis due to Clostridium difficile: Secondary | ICD-10-CM | POA: Diagnosis present

## 2014-12-08 DIAGNOSIS — Z79899 Other long term (current) drug therapy: Secondary | ICD-10-CM | POA: Diagnosis not present

## 2014-12-08 DIAGNOSIS — R630 Anorexia: Secondary | ICD-10-CM | POA: Diagnosis not present

## 2014-12-08 DIAGNOSIS — Z7902 Long term (current) use of antithrombotics/antiplatelets: Secondary | ICD-10-CM | POA: Diagnosis not present

## 2014-12-08 HISTORY — DX: Chronic obstructive pulmonary disease, unspecified: J44.9

## 2014-12-08 HISTORY — DX: Essential (primary) hypertension: I10

## 2014-12-08 LAB — BASIC METABOLIC PANEL
Anion gap: 18 — ABNORMAL HIGH (ref 5–15)
BUN: 123 mg/dL — ABNORMAL HIGH (ref 6–20)
CO2: 20 mmol/L — ABNORMAL LOW (ref 22–32)
CREATININE: 9.57 mg/dL — AB (ref 0.61–1.24)
Calcium: 9.1 mg/dL (ref 8.9–10.3)
Chloride: 100 mmol/L — ABNORMAL LOW (ref 101–111)
GFR calc non Af Amer: 5 mL/min — ABNORMAL LOW (ref >60)
GFR, EST AFRICAN AMERICAN: 6 mL/min — AB (ref >60)
Glucose, Bld: 122 mg/dL — ABNORMAL HIGH (ref 65–99)
Potassium: 6 mmol/L — ABNORMAL HIGH (ref 3.5–5.1)
SODIUM: 138 mmol/L (ref 135–145)

## 2014-12-08 LAB — CREATININE, SERUM
Creatinine, Ser: 9.64 mg/dL — ABNORMAL HIGH (ref 0.61–1.24)
GFR calc Af Amer: 5 mL/min — ABNORMAL LOW (ref >60)
GFR calc non Af Amer: 5 mL/min — ABNORMAL LOW (ref >60)

## 2014-12-08 LAB — CBC
HCT: 39.7 % — ABNORMAL LOW (ref 40.0–52.0)
HEMATOCRIT: 38.2 % — AB (ref 40.0–52.0)
HEMOGLOBIN: 12.1 g/dL — AB (ref 13.0–18.0)
Hemoglobin: 11.8 g/dL — ABNORMAL LOW (ref 13.0–18.0)
MCH: 26.8 pg (ref 26.0–34.0)
MCH: 27.4 pg (ref 26.0–34.0)
MCHC: 30.5 g/dL — ABNORMAL LOW (ref 32.0–36.0)
MCHC: 30.8 g/dL — ABNORMAL LOW (ref 32.0–36.0)
MCV: 88.1 fL (ref 80.0–100.0)
MCV: 89 fL (ref 80.0–100.0)
PLATELETS: 150 10*3/uL (ref 150–440)
Platelets: 134 10*3/uL — ABNORMAL LOW (ref 150–440)
RBC: 4.5 MIL/uL (ref 4.40–5.90)
RBC: 4.3 MIL/uL — ABNORMAL LOW (ref 4.40–5.90)
RDW: 19.7 % — ABNORMAL HIGH (ref 11.5–14.5)
RDW: 19.8 % — AB (ref 11.5–14.5)
WBC: 4.3 10*3/uL (ref 3.8–10.6)
WBC: 4.5 10*3/uL (ref 3.8–10.6)

## 2014-12-08 LAB — GLUCOSE, CAPILLARY: Glucose-Capillary: 98 mg/dL (ref 70–99)

## 2014-12-08 MED ORDER — ISOSORBIDE MONONITRATE ER 60 MG PO TB24
90.0000 mg | ORAL_TABLET | Freq: Every day | ORAL | Status: DC
  Administered 2014-12-08 – 2014-12-10 (×3): 90 mg via ORAL
  Filled 2014-12-08 (×3): qty 1

## 2014-12-08 MED ORDER — ASPIRIN 81 MG PO TABS
81.0000 mg | ORAL_TABLET | Freq: Every day | ORAL | Status: DC
  Administered 2014-12-09 – 2014-12-10 (×2): 81 mg via ORAL
  Filled 2014-12-08 (×4): qty 1

## 2014-12-08 MED ORDER — LORATADINE 10 MG PO TABS
10.0000 mg | ORAL_TABLET | Freq: Every day | ORAL | Status: DC
  Administered 2014-12-08 – 2014-12-10 (×3): 10 mg via ORAL
  Filled 2014-12-08 (×3): qty 1

## 2014-12-08 MED ORDER — HEPARIN SODIUM (PORCINE) 5000 UNIT/ML IJ SOLN
5000.0000 [IU] | Freq: Three times a day (TID) | INTRAMUSCULAR | Status: DC
  Filled 2014-12-08 (×2): qty 1

## 2014-12-08 MED ORDER — FUROSEMIDE 10 MG/ML IJ SOLN
INTRAMUSCULAR | Status: AC
  Administered 2014-12-08: 80 mg via INTRAVENOUS
  Filled 2014-12-08: qty 10

## 2014-12-08 MED ORDER — HYDRALAZINE HCL 50 MG PO TABS
100.0000 mg | ORAL_TABLET | Freq: Three times a day (TID) | ORAL | Status: DC
  Administered 2014-12-08 – 2014-12-10 (×5): 100 mg via ORAL
  Filled 2014-12-08 (×5): qty 2

## 2014-12-08 MED ORDER — ONDANSETRON HCL 4 MG/2ML IJ SOLN: 4.0000 mg | Freq: Four times a day (QID) | INTRAMUSCULAR | Status: DC | PRN

## 2014-12-08 MED ORDER — DEXTROSE 50 % IV SOLN
INTRAVENOUS | Status: AC
  Filled 2014-12-08: qty 50

## 2014-12-08 MED ORDER — TRAMADOL HCL 50 MG PO TABS: 50.0000 mg | ORAL_TABLET | Freq: Four times a day (QID) | ORAL | Status: DC | PRN

## 2014-12-08 MED ORDER — ALBUTEROL SULFATE (2.5 MG/3ML) 0.083% IN NEBU: 2.5000 mg | INHALATION_SOLUTION | RESPIRATORY_TRACT | Status: DC | PRN

## 2014-12-08 MED ORDER — FUROSEMIDE 10 MG/ML IJ SOLN
80.0000 mg | Freq: Once | INTRAMUSCULAR | Status: AC
  Administered 2014-12-08: 80 mg via INTRAVENOUS

## 2014-12-08 MED ORDER — NITROGLYCERIN 0.4 MG SL SUBL: 0.4000 mg | SUBLINGUAL_TABLET | SUBLINGUAL | Status: DC | PRN

## 2014-12-08 MED ORDER — BUDESONIDE-FORMOTEROL FUMARATE 80-4.5 MCG/ACT IN AERO
2.0000 | INHALATION_SPRAY | Freq: Two times a day (BID) | RESPIRATORY_TRACT | Status: DC
  Administered 2014-12-09 – 2014-12-10 (×2): 2 via RESPIRATORY_TRACT
  Filled 2014-12-08: qty 6.9

## 2014-12-08 MED ORDER — CARVEDILOL 25 MG PO TABS
25.0000 mg | ORAL_TABLET | Freq: Two times a day (BID) | ORAL | Status: DC
  Administered 2014-12-09 – 2014-12-10 (×3): 25 mg via ORAL
  Filled 2014-12-08 (×3): qty 1

## 2014-12-08 MED ORDER — ACETAMINOPHEN 650 MG RE SUPP: 650.0000 mg | Freq: Four times a day (QID) | RECTAL | Status: DC | PRN

## 2014-12-08 MED ORDER — GABAPENTIN 300 MG PO CAPS
300.0000 mg | ORAL_CAPSULE | Freq: Every day | ORAL | Status: DC
  Administered 2014-12-08 – 2014-12-09 (×2): 300 mg via ORAL
  Filled 2014-12-08 (×2): qty 1

## 2014-12-08 MED ORDER — INSULIN ASPART 100 UNIT/ML ~~LOC~~ SOLN
SUBCUTANEOUS | Status: AC
  Administered 2014-12-08: 6 [IU] via INTRAVENOUS
  Filled 2014-12-08: qty 6

## 2014-12-08 MED ORDER — DEXTROSE 50 % IV SOLN
1.0000 | Freq: Once | INTRAVENOUS | Status: AC
  Administered 2014-12-08: 50 mL via INTRAVENOUS
  Administered 2014-12-08: 20:00:00 via INTRAVENOUS

## 2014-12-08 MED ORDER — FUROSEMIDE 10 MG/ML PO SOLN
40.0000 mg | Freq: Two times a day (BID) | ORAL | Status: DC
  Administered 2014-12-09 – 2014-12-10 (×2): 40 mg via ORAL
  Filled 2014-12-08: qty 4
  Filled 2014-12-08 (×4): qty 5
  Filled 2014-12-08 (×2): qty 4

## 2014-12-08 MED ORDER — INSULIN ASPART 100 UNIT/ML ~~LOC~~ SOLN
6.0000 [IU] | Freq: Once | SUBCUTANEOUS | Status: AC
  Administered 2014-12-08 (×2): 6 [IU] via INTRAVENOUS

## 2014-12-08 MED ORDER — TEMAZEPAM 15 MG PO CAPS
15.0000 mg | ORAL_CAPSULE | Freq: Every day | ORAL | Status: DC
  Administered 2014-12-08 – 2014-12-09 (×2): 15 mg via ORAL
  Filled 2014-12-08 (×2): qty 1

## 2014-12-08 MED ORDER — ACETAMINOPHEN 325 MG PO TABS: 650.0000 mg | ORAL_TABLET | Freq: Four times a day (QID) | ORAL | Status: DC | PRN

## 2014-12-08 MED ORDER — ONDANSETRON HCL 4 MG PO TABS: 4.0000 mg | ORAL_TABLET | Freq: Four times a day (QID) | ORAL | Status: DC | PRN

## 2014-12-08 NOTE — H&P (Signed)
[facility] [place] at [facility]   PATIENT NAME: Chris ShapeJohn Howe    MR#:  [id number]  DATE OF BIRTH:  [birth year]  DATE OF ADMISSION:  12/08/2014  PRIMARY CARE PHYSICIAN: PROVIDER NOT IN SYSTEM   REQUESTING/REFERRING PHYSICIAN: Dr. Derrill KayGoodman  CHIEF COMPLAINT:   Chief Complaint  Patient presents with  . Edema  . Per facility pt refusing to eat and take meds    Per facility pt refusing to eat and take meds    HISTORY OF PRESENT ILLNESS:  Chris Howe  is a 74 y.o. male with a known history of CHF ejection fraction 20%, hypertension, ESRD and COPD. Patient was sent to ED from rest to home due to generalized weakness and dizziness. Patient is a alert awake and oriented. Patient has a chronic leg edema, which has been worsening for the past a few days. In addition, patient has a generalized weakness, dizziness and poor oral intake. Patient denies any cough, shortness breasts or sputum. He was found to have a high potassium is 6.0 ED, and was treated with the insulin and the D50. The patient is in hospice care. He is not on hemodialysis  PAST MEDICAL HISTORY:   Past Medical History  Diagnosis Date  . CKD (chronic kidney disease), stage IV   . Chronic systolic CHF (congestive heart failure)     a. echo 01/2011: EF 15%, severe global HK,   . History of CVA (cerebrovascular accident)   . COPD (chronic obstructive pulmonary disease)   . HTN (hypertension)     PAST SURGICAL HISTORY:  History reviewed. No pertinent past surgical history.  SOCIAL HISTORY:   History  Substance Use Topics  . Smoking status: Former Smoker -- 1.00 packs/day for 50 years    Types: Cigarettes  . Smokeless tobacco: Not on file  . Alcohol Use: No    FAMILY HISTORY:  History reviewed. No pertinent family history.  DRUG ALLERGIES:   Allergies  Allergen Reactions  . Furosemide     REACTION: pt states he thinks he is allergic because his eye itches but he still takes the furosemide     REVIEW OF SYSTEMS:  CONSTITUTIONAL: No fever, but has fatigue and weakness.  EYES: No blurred or double vision.  EARS, NOSE, AND THROAT: No tinnitus or ear pain.  RESPIRATORY: No cough, shortness of breath, wheezing or hemoptysis.  CARDIOVASCULAR: No chest pain, orthopnea, but has leg edema edema.  GASTROINTESTINAL: No nausea, vomiting, has a diarrhea for 3 days, no abdominal pain.  GENITOURINARY: No dysuria, hematuria.  ENDOCRINE: No polyuria, nocturia,  HEMATOLOGY: No anemia, easy bruising or bleeding SKIN: No rash or lesion. MUSCULOSKELETAL: No joint pain or arthritis.   NEUROLOGIC: No tingling, numbness, weakness.  PSYCHIATRY: No anxiety or depression.   MEDICATIONS AT HOME:   Prior to Admission medications   Medication Sig Start Date End Date Taking? Authorizing Provider  aspirin 81 MG tablet Take 81 mg by mouth daily.   Yes Historical Provider, MD  budesonide-formoterol (SYMBICORT) 80-4.5 MCG/ACT inhaler Inhale 2 puffs into the lungs 2 (two) times daily.   Yes Historical Provider, MD  carvedilol (COREG) 25 MG tablet Take 25 mg by mouth 2 (two) times daily with a meal.   Yes Historical Provider, MD  furosemide (LASIX) 40 MG tablet Take 80 mg by mouth 2 (two) times daily.   Yes Historical Provider, MD  gabapentin (NEURONTIN) 300 MG capsule Take 300 mg by mouth at bedtime.    Yes Historical Provider, MD  hydrALAZINE (APRESOLINE) 100 MG tablet Take 100 mg by mouth 3 (three) times daily.   Yes Historical Provider, MD  isosorbide mononitrate (IMDUR) 30 MG 24 hr tablet Take 90 mg by mouth daily.    Yes Historical Provider, MD  loratadine (CLARITIN) 10 MG tablet Take 10 mg by mouth daily.   Yes Historical Provider, MD  temazepam (RESTORIL) 15 MG capsule Take 15 mg by mouth at bedtime. [facility]   Yes Historical Provider, MD  traMADol (ULTRAM) 50 MG tablet Take 50 mg by mouth every 6 (six) hours as needed for moderate pain.   Yes Historical Provider, MD  atorvastatin  (LIPITOR) 40 MG tablet TAKE ONE TABLET BY MOUTH EVERY DAY Patient not taking: Reported on 12/08/2014 02/01/11   Antonieta Ibaimothy J Gollan, MD  furosemide (LASIX) 40 MG tablet Take 1 tablet (40 mg total) by mouth daily. Patient not taking: Reported on 12/08/2014 03/26/13   Antonieta Ibaimothy J Gollan, MD  nitroGLYCERIN (NITROSTAT) 0.4 MG SL tablet Place 0.4 mg under the tongue every 5 (five) minutes as needed.      Historical Provider, MD  PLAVIX 75 MG tablet TAKE ONE TABLET BY MOUTH EVERY DAY Patient not taking: Reported on 12/08/2014 02/17/11   Antonieta Ibaimothy J Gollan, MD      VITAL SIGNS:  Blood pressure 160/107, pulse 77, temperature 97.6 F (36.4 C), temperature source Oral, resp. rate 21, height 5\' 10"  (1.778 m), weight 54.432 kg (120 lb), SpO2 93 %.  PHYSICAL EXAMINATION:  GENERAL:  74 y.o.-year-old patient lying in the bed with no acute distress.  EYES: Pupils equal, round, reactive to light and accommodation. No scleral icterus. Extraocular muscles intact.  HEENT: Head atraumatic, normocephalic. Oropharynx and nasopharynx clear.  NECK:  Supple, no jugular venous distention. No thyroid enlargement, no tenderness.  LUNGS: Normal breath sounds bilaterally, no wheezing, rales,rhonchi or crepitation. No use of accessory muscles of respiration.  CARDIOVASCULAR: S1, S2 normal. No murmurs, rubs, or gallops.  ABDOMEN: Soft, nontender, but distended. Bowel sounds present. No organomegaly or mass. Positive for ascites signs. EXTREMITIES: Bilateral lower extremity edema, no cyanosis, or clubbing.  NEUROLOGIC: Cranial nerves II through XII are intact. Muscle strength 5/5 in all extremities. Sensation intact. Gait not checked.  PSYCHIATRIC: The patient is alert and oriented x 3.  SKIN: No obvious rash, lesion, or ulcer.   LABORATORY PANEL:   CBC  Recent Labs Lab 12/08/14 1540  WBC 4.3  HGB 12.1*  HCT 39.7*  PLT 150    ------------------------------------------------------------------------------------------------------------------  Chemistries   Recent Labs Lab 12/08/14 1540  NA 138  K 6.0*  CL 100*  CO2 20*  GLUCOSE 122*  BUN 123*  CREATININE 9.57*  CALCIUM 9.1   ------------------------------------------------------------------------------------------------------------------  Cardiac Enzymes No results for input(s): TROPONINI in the last 168 hours. ------------------------------------------------------------------------------------------------------------------  RADIOLOGY:  No results found.  EKG:   Orders placed or performed during the hospital encounter of 12/08/14  . ED EKG  . ED EKG    IMPRESSION AND PLAN:   Hyperkalemia. The patient was treated with the insulin and D50, please follow-up potassium level. ESRD. The patient is not on dialysis. I will request nephrology consult. Chronic systolic CHF with ejection fraction 20%. I will increase Lasix dose from 40 mg daily to twice a day IV. COPD. Stable. Hypertension.Continue patient's home hypertension medications.    All the records are reviewed and case discussed with ED provider. Management plans discussed with the patient. CODE STATUS: DNR.  TOTAL TIME TAKING CARE OF THIS  PATIENT: 57 minutes.    Shaune Pollack M.D on 12/08/2014 at 8:31 PM  Between 7am to 6pm - Pager - [phone]  After 6pm go to [email or web address] - password [email or web address] [facility]  [place] [facility]  Office  [phone]  CC: Primary care physician; PROVIDER NOT IN SYSTEM

## 2014-12-08 NOTE — ED Notes (Signed)
Pt asked for R side rail to be put down. RN explained to pt that he was considered a high fall risk and therefore could not have the side rail down d/t being so far from the nurse's station and not able to be directly visualized. Pt requested to speak to manager, spoke with Elmarie Shileyiffany, RN, who told pt the same thing. Upon leaving the room both side rails were up and in place, NAD noted at this time.

## 2014-12-08 NOTE — ED Notes (Signed)
Patient denies pain and is resting comfortably.  

## 2014-12-08 NOTE — ED Notes (Addendum)
Pt has bilateral swelling to his knees, L hand, abdomen, and both of his feet as well as R side of his face. Pt denies respiratory difficulty.  Pt states the swelling in his abdomen is normal for him. Pt states swelling has been going on for approx 1.5 weeks. [facility] [place] for refusing to eat/drink or take his medications. Breath sounds are clear bilaterally. Pulses intact. Denies abdominal pain upon palpation, bowel sounds x4. Pt able to move all extremeties.

## 2014-12-08 NOTE — ED Notes (Signed)
Went into room with Dixie DialsLiz G, RN. Pt had side rail down and had his arm and leg out of the beg pressing the call bell. Pt placed fully back into the bed, side rail restored to up position. Manpreet Strey, RN and Dixie DialsLiz G, RN placed yellow socks on pt as well as posey pads of the floor around pt bed.

## 2014-12-08 NOTE — ED Notes (Signed)
MD at bedside. 

## 2014-12-08 NOTE — ED Notes (Signed)
Per EMS [facility] [place] with C/O blurred vision (wife states he has hx of cataracts and glaucoma). Per facility pt has been not eating or taking meds for the last 4 days. Pt states he began to not have appetite so he quit eating, then he began to get sick from his medications so he quit taking them. Per facility pt also has swelling to both knees, abdomen, and L hand. Pt has DNR at bedside. Pt alert to verbal stimulation, but will talk to staff.

## 2014-12-08 NOTE — ED Provider Notes (Signed)
[facility] Emergency Department Provider Note  ____________________________________________  Time seen: 1620  I have reviewed the triage vital signs and the nursing notes.   HISTORY  Chief Complaint Edema and Per facility pt refusing to eat and take meds   History limited by: Not Limited   HPI Chris BalintJohn W Reiley is a 74 y.o. male who presents from home place of [place] because of increased swelling. Patient states that the swelling is gone worse for the past month, it is been increasingly worse the past couple days. Patient does state that he has not been taking his Lasix. He does have some discomfort in his legs with the swelling. He denies any shortness of breath. He states he has not been taking his Lasix because it does not like the way it makes her feel. Denies any chest pain, shortness of breath, fevers.     Past Medical History  Diagnosis Date  . CKD (chronic kidney disease), stage IV   . Chronic systolic CHF (congestive heart failure)     a. echo 01/2011: EF 15%, severe global HK,   . History of CVA (cerebrovascular accident)   . COPD (chronic obstructive pulmonary disease)   . HTN (hypertension)     Patient Active Problem List   Diagnosis Date Noted  . Chronic systolic CHF (congestive heart failure), NYHA class 3 09/12/2012  . Dizziness 11/04/2010  . Hyperlipidemia 11/04/2010  . Smoking 11/04/2010  . CARDIOMYOPATHY, ISCHEMIC 02/19/2010  . 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 DYSPNEA 01/07/2010  . ABNORMAL EKG 01/07/2010    History reviewed. No pertinent past surgical history.  Current Outpatient Rx  Name  Route  Sig  Dispense  Refill  . aspirin 81 MG tablet   Oral   Take 81 mg by mouth daily.         . budesonide-formoterol (SYMBICORT) 80-4.5 MCG/ACT inhaler   Inhalation   Inhale 2 puffs into the lungs 2 (two) times daily.         . carvedilol (COREG) 25 MG tablet   Oral   Take 25 mg by mouth 2 (two) times daily with a  meal.         . furosemide (LASIX) 40 MG tablet   Oral   Take 80 mg by mouth 2 (two) times daily.         Marland Kitchen. gabapentin (NEURONTIN) 300 MG capsule   Oral   Take 300 mg by mouth at bedtime.          . hydrALAZINE (APRESOLINE) 100 MG tablet   Oral   Take 100 mg by mouth 3 (three) times daily.         . isosorbide mononitrate (IMDUR) 30 MG 24 hr tablet   Oral   Take 90 mg by mouth daily.          Marland Kitchen. loratadine (CLARITIN) 10 MG tablet   Oral   Take 10 mg by mouth daily.         . temazepam (RESTORIL) 15 MG capsule   Oral   Take 15 mg by mouth at bedtime. Take every night per MAR         . traMADol (ULTRAM) 50 MG tablet   Oral   Take 50 mg by mouth every 6 (six) hours as needed for moderate pain.         Marland Kitchen. atorvastatin (LIPITOR) 40 MG tablet      TAKE ONE TABLET BY MOUTH EVERY DAY Patient not taking: Reported on 12/08/2014  30 tablet   6   . furosemide (LASIX) 40 MG tablet   Oral   Take 1 tablet (40 mg total) by mouth daily. Patient not taking: Reported on 12/08/2014   30 tablet   6   . nitroGLYCERIN (NITROSTAT) 0.4 MG SL tablet   Sublingual   Place 0.4 mg under the tongue every 5 (five) minutes as needed.           Marland Kitchen PLAVIX 75 MG tablet      TAKE ONE TABLET BY MOUTH EVERY DAY Patient not taking: Reported on 12/08/2014   30 each   6     Allergies Furosemide  History reviewed. No pertinent family history.  Social History History  Substance Use Topics  . Smoking status: Former Smoker -- 1.00 packs/day for 50 years    Types: Cigarettes  . Smokeless tobacco: Not on file  . Alcohol Use: No    Review of Systems  Constitutional: Negative for fever. Cardiovascular: Negative for chest pain. Respiratory: Negative for shortness of breath. Gastrointestinal: Negative for abdominal pain, vomiting and diarrhea. Genitourinary: Negative for dysuria. Musculoskeletal: Negative for back pain. Lower extremity swelling left upper extremity  swelling Skin: Negative for rash. Neurological: Negative for headaches, focal weakness or numbness.   10-point ROS otherwise negative.  ____________________________________________   PHYSICAL EXAM:  VITAL SIGNS: ED Triage Vitals  Enc Vitals Group     BP 12/08/14 1543 140/104 mmHg     Pulse Rate 12/08/14 1543 76     Resp 12/08/14 1543 24     Temp 12/08/14 1543 97.6 F (36.4 C)     Temp Source 12/08/14 1543 Oral     SpO2 12/08/14 1543 93 %     Weight 12/08/14 1543 120 lb (54.432 kg)     Height 12/08/14 1543  (1.778 m)   Constitutional: Alert and oriented. Well appearing and in no distress. Eyes: Conjunctivae are normal. PERRL. Normal extraocular movements. ENT   Head: Normocephalic and atraumatic.   Nose: No congestion/rhinnorhea.   Mouth/Throat: Mucous membranes are moist.   Neck: No stridor. Hematological/Lymphatic/Immunilogical: No cervical lymphadenopathy. Cardiovascular: Normal rate, regular rhythm.  No murmurs, rubs, or gallops. Respiratory: Normal respiratory effort without tachypnea nor retractions. Breath sounds are clear and equal bilaterally. No wheezes/rales/rhonchi. Gastrointestinal: Soft and nontender. No distention.  Genitourinary: Deferred Musculoskeletal: Normal range of motion in all extremities. Bilateral lower extremity 2+ pitting edema. 1+ left upper extremity edema Neurologic:  Normal speech and language. No gross focal neurologic deficits are appreciated. Speech is normal.  Skin:  Skin is warm, dry and intact. No rash noted. Psychiatric: Mood and affect are normal. Speech and behavior are normal. Patient exhibits appropriate insight and judgment.  ____________________________________________    LABS (pertinent positives/negatives)  Labs Reviewed  CBC - Abnormal; Notable for the following:    Hemoglobin 12.1 (*)    HCT 39.7 (*)    MCHC 30.5 (*)    RDW 19.7 (*)    All other components within normal limits  BASIC METABOLIC  PANEL - Abnormal; Notable for the following:    Potassium 6.0 (*)    Chloride 100 (*)    CO2 20 (*)    Glucose, Bld 122 (*)    BUN 123 (*)    Creatinine, Ser 9.57 (*)    GFR calc non Af Amer 5 (*)    GFR calc Af Amer 6 (*)    Anion gap 18 (*)    All other components within normal limits  GLUCOSE, CAPILLARY     ____________________________________________   EKG  EKG Time: 1539 Rate: 76 Rhythm: Ectopic atrial rhythm Axis: Left axis deviation Intervals: QTc 456 QRS: Q waves 2, 3, aVF, V1 ST changes: T-wave inversion V4 through V6, no ST elevation    ____________________________________________    RADIOLOGY  None  ____________________________________________   PROCEDURES  Procedure(s) performed: None  Critical Care performed: No  ____________________________________________   INITIAL IMPRESSION / ASSESSMENT AND PLAN / ED COURSE  Pertinent labs & imaging results that were available during my care of the patient were reviewed by me and considered in my medical decision making (see chart for details).  Patient here with swelling. Patient is noncompliant with his Lasix. Discussed with patient that he should take his Lasix if he wants to decrease swelling. Will give dose of IV Lasix here.   ----------------------------------------- 8:42 PM on 12/08/2014 -----------------------------------------  Patient's lab work concerning for elevated potassium. Will give insulin and dextrose. No EKG  changes consistent with hyperkalemia will admit patient   ____________________________________________   FINAL CLINICAL IMPRESSION(S) / ED DIAGNOSES  Final diagnoses:  Peripheral edema     Phineas SemenGraydon Jovan Colligan, MD 12/08/14 2042

## 2014-12-09 DIAGNOSIS — B9689 Other specified bacterial agents as the cause of diseases classified elsewhere: Secondary | ICD-10-CM

## 2014-12-09 DIAGNOSIS — I2510 Atherosclerotic heart disease of native coronary artery without angina pectoris: Secondary | ICD-10-CM

## 2014-12-09 DIAGNOSIS — N186 End stage renal disease: Secondary | ICD-10-CM

## 2014-12-09 DIAGNOSIS — I252 Old myocardial infarction: Secondary | ICD-10-CM

## 2014-12-09 DIAGNOSIS — Z8673 Personal history of transient ischemic attack (TIA), and cerebral infarction without residual deficits: Secondary | ICD-10-CM

## 2014-12-09 DIAGNOSIS — I272 Other secondary pulmonary hypertension: Secondary | ICD-10-CM

## 2014-12-09 DIAGNOSIS — D631 Anemia in chronic kidney disease: Secondary | ICD-10-CM

## 2014-12-09 DIAGNOSIS — F17210 Nicotine dependence, cigarettes, uncomplicated: Secondary | ICD-10-CM

## 2014-12-09 DIAGNOSIS — R630 Anorexia: Secondary | ICD-10-CM

## 2014-12-09 DIAGNOSIS — I081 Rheumatic disorders of both mitral and tricuspid valves: Secondary | ICD-10-CM

## 2014-12-09 DIAGNOSIS — I255 Ischemic cardiomyopathy: Secondary | ICD-10-CM

## 2014-12-09 DIAGNOSIS — J449 Chronic obstructive pulmonary disease, unspecified: Secondary | ICD-10-CM

## 2014-12-09 DIAGNOSIS — R609 Edema, unspecified: Secondary | ICD-10-CM

## 2014-12-09 DIAGNOSIS — Z515 Encounter for palliative care: Secondary | ICD-10-CM

## 2014-12-09 DIAGNOSIS — I739 Peripheral vascular disease, unspecified: Secondary | ICD-10-CM

## 2014-12-09 DIAGNOSIS — R531 Weakness: Secondary | ICD-10-CM

## 2014-12-09 LAB — CBC
HCT: 35.6 % — ABNORMAL LOW (ref 40.0–52.0)
Hemoglobin: 10.9 g/dL — ABNORMAL LOW (ref 13.0–18.0)
MCH: 26.8 pg (ref 26.0–34.0)
MCHC: 30.7 g/dL — ABNORMAL LOW (ref 32.0–36.0)
MCV: 87.4 fL (ref 80.0–100.0)
Platelets: 119 10*3/uL — ABNORMAL LOW (ref 150–440)
RBC: 4.08 MIL/uL — ABNORMAL LOW (ref 4.40–5.90)
RDW: 19.6 % — ABNORMAL HIGH (ref 11.5–14.5)
WBC: 3.9 10*3/uL (ref 3.8–10.6)

## 2014-12-09 LAB — POTASSIUM: POTASSIUM: 5.3 mmol/L — AB (ref 3.5–5.1)

## 2014-12-09 LAB — CLOSTRIDIUM DIFFICILE BY PCR: Toxigenic C. Difficile by PCR: POSITIVE — AB

## 2014-12-09 LAB — BASIC METABOLIC PANEL
ANION GAP: 19 — AB (ref 5–15)
BUN: 132 mg/dL — ABNORMAL HIGH (ref 6–20)
CHLORIDE: 102 mmol/L (ref 101–111)
CO2: 21 mmol/L — AB (ref 22–32)
Calcium: 8.9 mg/dL (ref 8.9–10.3)
Creatinine, Ser: 9.67 mg/dL — ABNORMAL HIGH (ref 0.61–1.24)
GFR, EST AFRICAN AMERICAN: 5 mL/min — AB (ref >60)
GFR, EST NON AFRICAN AMERICAN: 5 mL/min — AB (ref >60)
GLUCOSE: 80 mg/dL (ref 65–99)
POTASSIUM: 5.5 mmol/L — AB (ref 3.5–5.1)
Sodium: 142 mmol/L (ref 135–145)

## 2014-12-09 LAB — TROPONIN I: Troponin I: 0.21 ng/mL — ABNORMAL HIGH (ref <0.031)

## 2014-12-09 LAB — C DIFFICILE QUICK SCREEN W PCR REFLEX
C DIFFICLE (CDIFF) ANTIGEN: POSITIVE
C Diff toxin: NEGATIVE

## 2014-12-09 MED ORDER — NEPRO/CARBSTEADY PO LIQD
237.0000 mL | Freq: Two times a day (BID) | ORAL | Status: DC
  Administered 2014-12-10: 237 mL via ORAL

## 2014-12-09 NOTE — Progress Notes (Signed)
Subjective:   Patient presented with critical BUN and Cr  Patient not able ro provide much information Per noted PO intake was poor at the nursing [facility] [place]  Objective:  Vital signs in last 24 hours:  Temp:  [97.5 F (36.4 C)] 97.5 F (36.4 C) (05/08 2130) Pulse Rate:  [68-78] 68 (05/09 0910) Resp:  [18-24] 18 (05/09 0910) BP: (139-160)/(74-107) 151/74 mmHg (05/09 0910) SpO2:  [93 %-96 %] 94 % (05/09 0910)  Weight change:  Filed Weights   12/08/14 1543  Weight: 54.432 kg (120 lb)    Intake/Output: I/O last 3 completed shifts: In: 60 [P.O.:60] Out: 0    Intake/Output this shift:  Total I/O In: -  Out: 50 [Urine:50]  Physical Exam: General: NAD,   Head: Normocephalic, atraumatic. Moist oral mucosal membranes  Eyes: Anicteric,  Neck: Supple, trachea midline  Lungs:  Clear to auscultation  Heart: Regular rate and rhythm  Abdomen:  Soft, nontender,   Extremities:  ++ peripheral edema.  Neurologic: Nonfocal, moving all four extremities  Skin: No lesions  Access: none    Basic Metabolic Panel:  Recent Labs Lab 12/08/14 1540 12/08/14 2205 12/09/14 0702  NA 138  --  142  K 6.0*  --  5.5*  CL 100*  --  102  CO2 20*  --  21*  GLUCOSE 122*  --  80  BUN 123*  --  132*  CREATININE 9.57* 9.64* 9.67*  CALCIUM 9.1  --  8.9    Liver Function Tests: No results for input(s): AST, ALT, ALKPHOS, BILITOT, PROT, ALBUMIN in the last 168 hours. No results for input(s): LIPASE, AMYLASE in the last 168 hours. No results for input(s): AMMONIA in the last 168 hours.  CBC:  Recent Labs Lab 12/08/14 1540 12/08/14 2205 12/09/14 0702  WBC 4.3 4.5 3.9  HGB 12.1* 11.8* 10.9*  HCT 39.7* 38.2* 35.6*  MCV 88.1 89.0 87.4  PLT 150 134* 119*    Cardiac Enzymes:  Recent Labs Lab 12/09/14 0730  TROPONINI 0.21*    BNP: Invalid input(s): POCBNP  CBG:  Recent Labs Lab 12/08/14 1927  GLUCAP 98    Microbiology: Results for orders placed or  performed during the hospital encounter of 12/08/14  C difficile quick scan w PCR reflex [facility]     Status: None   Collection Time: 12/09/14  9:47 AM  Result Value Ref Range Status   C Diff antigen POSITIVE  Final   C Diff toxin NEGATIVE  Final   C Diff interpretation   Final    Positive for toxigenic C. difficile, active toxin production not detected. Patient has toxigenic C. difficile organisms present in the bowel, but toxin was not detected. The patient may be a carrier or the level of toxin in the sample was below the limit  of detection. This information should be used in conjunction with the patient's clinical history when deciding on possible therapy.     Comment: CRITICAL RESULT CALLED TO, READ BACK BY AND VERIFIED WITH: GINA DENTON @ 1500 12/09/14 BGB   Clostridium Difficile by PCR     Status: Abnormal   Collection Time: 12/09/14  9:47 AM  Result Value Ref Range Status   C difficile by pcr POSITIVE (A) NEGATIVE Final    Coagulation Studies: No results for input(s): LABPROT, INR in the last 72 hours.  Urinalysis: No results for input(s): COLORURINE, LABSPEC, PHURINE, GLUCOSEU, HGBUR, BILIRUBINUR, KETONESUR, PROTEINUR, UROBILINOGEN, NITRITE, LEUKOCYTESUR in the last 72 hours.  Invalid  input(s): APPERANCEUR    Imaging: No results found.   Medications:     . aspirin  81 mg Oral Daily  . budesonide-formoterol  2 puff Inhalation BID  . carvedilol  25 mg Oral BID WC  . feeding supplement (NEPRO CARB STEADY)  237 mL Oral BID BM  . furosemide  40 mg Oral BID  . gabapentin  300 mg Oral QHS  . heparin  5,000 Units Subcutaneous 3 times per day  . hydrALAZINE  100 mg Oral TID  . isosorbide mononitrate  90 mg Oral Daily  . loratadine  10 mg Oral Daily  . temazepam  15 mg Oral QHS   acetaminophen **OR** acetaminophen, albuterol, nitroGLYCERIN, ondansetron **OR** ondansetron (ZOFRAN) IV, traMADol  Assessment/ Plan:  74 year old [place] male with past medical history  of congestive heart failure EF 15-20%, hypertension, CKD stage IV seconary to polycystic kidney disease, peripheral vascular disease  1. ARF on CKD st 4 probably ESRD - due to poor functional status and severe CHF patient's life expectancy is is less than 1 year - not good candidate for dialysis overall - will discuss with primary team - imaging - obtain Ultrasound 2. Peripheral edema - agree with lasix for now 3. C Diff positive.  Treatment as per primary team 4. Hyperkalemia - Low k Diet     LOS: 1 Inita Uram 5/9/20164:29 PM

## 2014-12-09 NOTE — Care Management Note (Signed)
Case Management Note  Patient Details  Name: Chris Howe MRN: [id number] Date of Birth: July 21, 1941  Subjective/Objective:   [facility] [place] with edema and refusing to eat and take medications.  Notified Dayna BarkerKaren Robertson with [facility].              Action/Plan:  CM following for goals of care.    Expected Discharge Date:  12/13/14               Expected Discharge Plan:  Home w Hospice Care  In-House Referral:  Hospice / Palliative Care  Discharge planning Services  CM Consult  Post Acute Care Choice:    Choice offered to:     DME Arranged:    DME Agency:     HH Arranged:    HH Agency:     Status of Service:  In process, will continue to follow  Medicare Important Message Given:    Date Medicare IM Given:    Medicare IM give by:    Date Additional Medicare IM Given:    Additional Medicare Important Message give by:     If discussed at Long Length of Stay Meetings, dates discussed:    Additional Comments:  Marily MemosLisa M Aadvika Konen, RN 12/09/2014, 9:50 AM

## 2014-12-09 NOTE — Progress Notes (Signed)
Visit made. Mr. Chris Howe is followed by [facility] with a hospice dx of End stage renal failure, he does have an out of facility DNR in place. Patient sent to [facility] ED on 12/08/14 following an on call nursing visit,  for evaluation of facial and upper extremity swelling/edema, oxygen desaturation and abdominal distention. This request was made by the patient and his daughter. He was admitted to room 219 on 12/08/14 at 9:23pm. Admitting dx; weakness, with hyperkalemia. While in the ED he received D 50, 50ml and novolog 6 units for treatment of an elevated potassium of 6.0. Potassium this morning is 5.5, bun and creatinine remain elevated. Patient seen at bedside, sitting up in the chair, awakened to voice, reports not sleeping last night and wanting to rest now. Writer stayed chair side, patient did not open his eyes again and appeared to be back to sleep, no nonverbal s/s of pain or discomfort noted, respirations even, unlabored. Breakfast tray at bedside, patient had eaten only a few bites. Oxygen on at 2 L via nasal cannula, started this morning per staff RN Charisse MarchJeanna for decreased oxygen saturation. Charisse MarchJeanna also reports that patient has had 2 loose stools since admission, stool has been sent for testing of C-diff, results pending. Writer spoke with CM Misty StanleyLisa and CSW [place], no discharge plan at this time, no family present at time of visit. Hospice team updated and aware of admission. Will continue to follow daily. Thank you Dayna BarkerKaren Robertson RN,BSN, [facility] [phone]

## 2014-12-09 NOTE — Progress Notes (Addendum)
Initial Nutrition Assessment  INTERVENTION:  Meals and Snacks: Cater to patient preferences Medical Food Supplement Therapy: Will recommend Nepro BID for added nutrition  NUTRITION DIAGNOSIS:  Inadequate oral intake related to acute illness as evidenced by meal completion < 25%.  GOAL:  Patient will meet greater than or equal to 90% of their needs  MONITOR:  Energy Intake Electrolyte and renal Profile Glucose Profile Anthropometrics UOP  REASON FOR ASSESSMENT:  Malnutrition Screening Tool    ASSESSMENT:  Pt admitted with weakness, hyperkalemia with chronic leg edema. PTA pt refusing po intake per MD note.  PMHx: CHF, HTN, ESRD on HD, COPD  PO Intake: Pt mumbling, slightly lethargic on visit today. Per MD note, appetite decreased PTA, refusing to eat. Pt eating only bites of eggs and sips of apple juice this am on visit.  Medication: Lasix, D5 at 8350mL/hr this am d/c  Labs: Electrolyte and Renal Profile:    Recent Labs Lab 12/08/14 1540 12/08/14 2205 12/09/14 0702  BUN 123*  --  132*  CREATININE 9.57* 9.64* 9.67*  NA 138  --  142  K 6.0*  --  5.5*   Glucose Profile:  Recent Labs  12/08/14 1927  GLUCAP 98   Protein Profile: No results for input(s): ALBUMIN in the last 168 hours.  Height:  Ht Readings from Last 1 Encounters:  12/08/14 5\' 10"  (1.778 m)    Weight:  Wt Readings from Last 1 Encounters:  12/08/14 120 lb (54.432 kg)    Ideal Body Weight:  75.5 kg  %IBW: 72% Per MST, pt reports weight loss PTA  Wt Readings from Last 10 Encounters:  12/08/14 120 lb (54.432 kg)  09/12/12 111 lb 4 oz (50.463 kg)  02/16/11 116 lb (52.617 kg)  11/04/10 114 lb (51.71 kg)  05/28/10 116 lb (52.617 kg)  02/19/10 118 lb (53.524 kg)  01/07/10 117 lb (53.071 kg)    BMI:  Body mass index is 17.22 kg/(m^2).    Unable to complete Nutrition-Focused physical exam at this time.   Estimated Nutritional Needs:  Kcal:  2169-2531kcals, (BEE: 1506kcals,  IF1.2-1.4, AF 1.2) using IBW of 75.5kg  Protein:  91-113g protein (1.2-1.5g/kg) using IBW of 75.5kg  Fluid:  UOP+108500mL  Diet Order:  Diet renal/carb modified with fluid restriction Diet-HS Snack?: Nothing; Room service appropriate?: Yes; Fluid consistency:: Thin  EDUCATION NEEDS:  Education needs no appropriate at this time   Intake/Output Summary (Last 24 hours) at 12/09/14 1450 Last data filed at 12/09/14 1300  Gross per 24 hour  Intake     60 ml  Output     50 ml  Net     10 ml    Last BM:  5/9  HIGH Care Level  Leda QuailAllyson Shirly Bartosiewicz, RD, LDN Pager [phone]

## 2014-12-09 NOTE — Clinical Social Work Note (Signed)
Clinical Social Work Assessment  Patient Details  Name: Chris Howe MRN: [id number] Date of Birth: [birth year]  Date of referral:  12/09/14               Reason for consult:  Facility Placement                Permission sought to share information with:  Family Supports Permission granted to share information::  Yes, Verbal Permission Granted  Name::        Agency::     Relationship::     Contact Information:     Housing/Transportation Living arrangements for the past 2 months:  Assisted Living Facility Source of Information:   (sibling: Chris Howe) Patient Interpreter Needed:  None Criminal Activity/Legal Involvement Pertinent to Current Situation/Hospitalization:  No - Comment as needed Significant Relationships:  Siblings Lives with:  Facility Resident Do you feel safe going back to the place where you live?  Yes Need for family participation in patient care:  No (Coment)  Care giving concerns:  [facility] at discharge.    Social Worker assessment / plan:  CSW spoke with patient's sister, Ms. Chris Howe, because patient has been too lethargic to hold a conversation. Ms. Chris Howe was able to confirm that [facility] about one week.  FL2 placed on patient's chart.   Employment status:  Disabled (Comment on whether or not currently receiving Disability) Insurance information:  Medicare PT Recommendations:  Not assessed at this time Information / Referral to community resources:  Other (Comment Required) ([facility])  Patient/Family's Response to care:  Patient's sister in agreement for [facility] to [facility].  Patient/Family's Understanding of and Emotional Response to Diagnosis, Current Treatment, and Prognosis:  Patient's sister allegedly requested transfer to hospital. [facility] to discuss further goals with patient's sister  and patient.   Emotional Assessment Appearance:  Appears older than stated age Attitude/Demeanor/Rapport:  Lethargic Affect (typically observed):  Unable to Assess Orientation:   (unable to assess) Alcohol / Substance use:  Not Applicable Psych involvement (Current and /or in the community):  No (Comment)  Discharge Needs  Concerns to be addressed:  No discharge needs identified Readmission within the last 30 days:  No Current discharge risk:  None Barriers to Discharge:  No Barriers Identified   York SpanielMonica Amra Shukla, LCSW 12/09/2014, 2:41 PM

## 2014-12-09 NOTE — Progress Notes (Signed)
[facility] [place] [facility]   PATIENT NAME: Chris ShapeJohn Howe    MR#:  [id number]  DATE OF BIRTH:  May 16, 1941  SUBJECTIVE:  CHIEF COMPLAINT:   Chief Complaint  Patient presents with  . Edema  . Per facility pt refusing to eat and take meds    Per facility pt refusing to eat and take meds    REVIEW OF SYSTEMS:  CONSTITUTIONAL: No fever, fatigue or weakness.  EYES: No blurred or double vision.  EARS, NOSE, AND THROAT: No tinnitus or ear pain.  RESPIRATORY: No cough, shortness of breath, wheezing or hemoptysis.  CARDIOVASCULAR: No chest pain, orthopnea, edema.  GASTROINTESTINAL: No nausea, vomiting, have diarrhea but no abdominal pain.  GENITOURINARY: No dysuria, hematuria.  ENDOCRINE: No polyuria, nocturia,  HEMATOLOGY: No anemia, easy bruising or bleeding SKIN: No rash or lesion. MUSCULOSKELETAL: No joint pain or arthritis.   NEUROLOGIC: No tingling, numbness,have generalized weakness.  PSYCHIATRY: No anxiety or depression.   ROS  DRUG ALLERGIES:   Allergies  Allergen Reactions  . Furosemide     REACTION: pt states he thinks he is allergic because his eye itches but he still takes the furosemide    VITALS:  Blood pressure 151/74, pulse 68, temperature 97.5 F (36.4 C), temperature source Oral, resp. rate 18, height 5\' 10"  (1.778 m), weight 54.432 kg (120 lb), SpO2 94 %.  PHYSICAL EXAMINATION:  GENERAL:  74 y.o.-year-old patient lying in the bed with no acute distress.  EYES: Pupils equal, round, reactive to light and accommodation. No scleral icterus. Extraocular muscles intact.  HEENT: Head atraumatic, normocephalic. Oropharynx and nasopharynx clear.  NECK:  Supple, no jugular venous distention. No thyroid enlargement, no tenderness.  LUNGS: Normal breath sounds bilaterally, no wheezing, rales,rhonchi or crepitation. No use of accessory muscles of respiration.  CARDIOVASCULAR: S1, S2 normal. No murmurs, rubs, or gallops.  ABDOMEN: Soft,  nontender, nondistended. Bowel sounds present. No organomegaly or mass.  EXTREMITIES: No pedal edema, cyanosis, or clubbing.  NEUROLOGIC: Cranial nerves II through XII are intact. Muscle strength 4/5 in all extremities. Sensation intact. Gait not checked.  PSYCHIATRIC: The patient is alert and oriented x 3.  SKIN: No obvious rash, lesion, or ulcer.   Physical Exam LABORATORY PANEL:   CBC  Recent Labs Lab 12/09/14 0702  WBC 3.9  HGB 10.9*  HCT 35.6*  PLT 119*   ------------------------------------------------------------------------------------------------------------------  Chemistries   Recent Labs Lab 12/09/14 0702  NA 142  K 5.5*  CL 102  CO2 21*  GLUCOSE 80  BUN 132*  CREATININE 9.67*  CALCIUM 8.9   ------------------------------------------------------------------------------------------------------------------  Cardiac Enzymes  Recent Labs Lab 12/09/14 0730  TROPONINI 0.21*   ------------------------------------------------------------------------------------------------------------------  RADIOLOGY:  No results found.  EKG:   Orders placed or performed during the hospital encounter of 12/08/14  . ED EKG  . ED EKG    ASSESSMENT AND PLAN:   * Hyperkalemia. The patient was treated with the insulin and D50, lower potassium level today. * ESRD. The patient is not on dialysis. nephrology consult. He don't want to get HD- I will call palliative care consult. * Chronic systolic CHF with ejection fraction 20%. Increase lasix. * COPD. Stable. * Hypertension.Continue patient's home hypertension medications. * decreased oral intake- Will call palliative care to help, Pt was already in hospice care.    All the records are reviewed and case discussed with Care Management/Social Workerr. Management plans discussed with the patient, family and they are in agreement.  CODE STATUS: DNR  TOTAL TIME TAKING CARE OF THIS PATIENT: 30 minutes.   POSSIBLE D/C IN  1-2 DAYS, DEPENDING ON CLINICAL CONDITION.   Altamese DillingVACHHANI, Tamsin Nader M.D on 12/09/2014 at 11:54 AM  Between 7am to 6pm - Pager - [phone]  After 6pm go to [email or web address] - password [email or web address]  [facility]  Office  [phone]  CC: Primary care physician; PROVIDER NOT IN SYSTEM

## 2014-12-09 NOTE — Consult Note (Signed)
Palliative Medicine Inpatient Consult Note   Name: Chris BalintJohn W Howe Date: 12/09/2014 MRN: [id number]  DOB: 1941-04-06  Referring Physician: Altamese DillingVaibhavkumar Vachhani, MD  Palliative Care consult requested for this 74 y.o. 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 He was admitted 12/08/14 with weakness, decreased po and facial swelling. Work-up significant for A/CKD which has worsened today. At present, pt is lying in bed. Minimally responsive. Family not present.     REVIEW OF SYSTEMS:  Patient is not able to provide ROS  SOCIAL HISTORY: Pt is a resident of [facility]. He is divorced. He has a daughter in [place], Chris Howe, who is pt's HCPOA. He has a son who lives locally. He is a [employer] and was exposed to agent orange. 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  reports that he has quit smoking. His smoking use included Cigarettes. He has a 50 pack-year smoking history. He does not have any smokeless tobacco history on file. He reports that he does not drink alcohol or use illicit drugs.  LEGAL DOCUMENTS:  Health Care Power of Attorney:  Chris Howe (daughter).  CODE STATUS: DNR  PAST MEDICAL HISTORY: Past Medical History  Diagnosis Date  . CKD (chronic kidney disease), stage IV   . Chronic systolic CHF (congestive heart failure)     a. echo 01/2011: EF 15%, severe global HK,   . History of CVA (cerebrovascular accident)   . COPD (chronic obstructive pulmonary disease)   . HTN (hypertension)     PAST SURGICAL HISTORY: History reviewed. No pertinent past surgical history.  ALLERGIES:  is allergic to furosemide.  MEDICATIONS:  Current  Facility-Administered Medications  Medication Dose Route Frequency Provider Last Rate Last Dose  . acetaminophen (TYLENOL) tablet 650 mg  650 mg Oral Q6H PRN Shaune PollackQing Chen, MD       Or  . acetaminophen (TYLENOL) suppository 650 mg  650 mg Rectal Q6H PRN Shaune PollackQing Chen, MD      . albuterol (PROVENTIL) (2.5 MG/3ML) 0.083% nebulizer solution 2.5 mg  2.5 mg Nebulization Q2H PRN Shaune PollackQing Chen, MD      . aspirin tablet 81 mg  81 mg Oral Daily Shaune PollackQing Chen, MD   81 mg at 12/09/14 1118  . budesonide-formoterol (SYMBICORT) 80-4.5 MCG/ACT inhaler 2 puff  2 puff Inhalation BID Shaune PollackQing Chen, MD   2 puff at 12/09/14 0902  . carvedilol (COREG) tablet 25 mg  25 mg Oral BID WC Shaune PollackQing Chen, MD   25 mg at 12/09/14 0910  . furosemide (LASIX) 10 MG/ML solution 40 mg  40 mg Oral BID Shaune PollackQing Chen, MD   40 mg at 12/09/14 1229  . gabapentin (NEURONTIN) capsule 300 mg  300 mg Oral QHS Shaune PollackQing Chen, MD   300 mg at 12/08/14 2217  . heparin injection 5,000 Units  5,000 Units Subcutaneous 3 times per day Shaune PollackQing Chen, MD   5,000 Units at 12/08/14 2219  . hydrALAZINE (APRESOLINE) tablet 100 mg  100 mg Oral TID Shaune PollackQing Chen, MD   100 mg at 12/09/14 1056  . isosorbide mononitrate (IMDUR) 24 hr tablet 90 mg  90 mg Oral Daily Shaune PollackQing Chen, MD  90 mg at 12/09/14 1054  . loratadine (CLARITIN) tablet 10 mg  10 mg Oral Daily Shaune PollackQing Chen, MD   10 mg at 12/09/14 1118  . nitroGLYCERIN (NITROSTAT) SL tablet 0.4 mg  0.4 mg Sublingual Q5 min PRN Shaune PollackQing Chen, MD      . ondansetron [facility](ZOFRAN) tablet 4 mg  4 mg Oral Q6H PRN Shaune PollackQing Chen, MD       Or  . ondansetron [facility](ZOFRAN) injection 4 mg  4 mg Intravenous Q6H PRN Shaune PollackQing Chen, MD      . temazepam (RESTORIL) capsule 15 mg  15 mg Oral QHS Shaune PollackQing Chen, MD   15 mg at 12/08/14 2217  . traMADol (ULTRAM) tablet 50 mg  50 mg Oral Q6H PRN Shaune PollackQing Chen, MD        Vital Signs: BP 151/74 mmHg  Pulse 68  Temp(Src) 97.5 F (36.4 C) (Oral)  Resp 18  Ht 5\' 10"  (1.778 m)  Wt 54.432 kg (120 lb)  BMI 17.22 kg/m2  SpO2 94% Filed Weights   12/08/14  1543  Weight: 54.432 kg (120 lb)    Estimated body mass index is 17.22 kg/(m^2) as calculated from the following:   Height as of this encounter: 5\' 10"  (1.778 m).   Weight as of this encounter: 54.432 kg (120 lb).  PERFORMANCE STATUS (ECOG) : 4 - Bedbound  PHYSICAL EXAM: Generall: critically ill-appearing HEENT: OP clear, poor dentition, hearing intact Neck: Trachea midline Cardiovascular: regular rate and rhythm Pulmonary/Chest: fair air movement ant fields, no audible wheeze Abdominal: Soft, NTTP, hypoactive bowel sounds GU: No SP tenderness Extremities: + edema  Neurological: Grossly nonfocal but difficult to assess Skin: no rashes Psychiatric: lethargic  LABS: CBC:  Recent Labs Lab 12/08/14 2205 12/09/14 0702  WBC 4.5 3.9  HGB 11.8* 10.9*  HCT 38.2* 35.6*  PLT 134* 119*   Comprehensive Metabolic Panel:  Recent Labs Lab 12/08/14 1540 12/08/14 2205 12/09/14 0702  NA 138  --  142  K 6.0*  --  5.5*  CL 100*  --  102  CO2 20*  --  21*  GLUCOSE 122*  --  80  BUN 123*  --  132*  CREATININE 9.57* 9.64* 9.67*  CALCIUM 9.1  --  8.9    IMPRESSION:  Chris Howe is a 74 yo man with PMH of ICM with EF < 20% (ECHO 01/2014), ESRD refused dialysis, COPD, ongoing tobacco use, CAD, NSTEMI (01/2010), PVD, 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 He was admitted 12/08/14 with weakness, decreased po and facial swelling. Work-up significant for A/CKD which has worsened today.  Chris Howe is well known to me from multiple previous hospitalizations. He has recently moved from home to an ALF and is followed in the facility by hospice. He has refused dialysis multiple times in the past. I do not feel that he is competent to make decisions for himself at the time and will try to speak with his daughter/HCPOA, Chris Howe. I called and left message with daughter.   PLAN: Speak with daughter/HCPOA  More than 50% of the visit was spent in counseling/coordination of  care: YES  Time spent: 70 minutes

## 2014-12-09 NOTE — Progress Notes (Signed)
Spoke with Dr. Elisabeth PigeonVachhani about elevated troponin of 0.21. No new orders given.

## 2014-12-10 LAB — BASIC METABOLIC PANEL
ANION GAP: 15 (ref 5–15)
BUN: 142 mg/dL — ABNORMAL HIGH (ref 6–20)
CALCIUM: 8.3 mg/dL — AB (ref 8.9–10.3)
CHLORIDE: 103 mmol/L (ref 101–111)
CO2: 22 mmol/L (ref 22–32)
Creatinine, Ser: 10.03 mg/dL — ABNORMAL HIGH (ref 0.61–1.24)
GFR calc non Af Amer: 5 mL/min — ABNORMAL LOW (ref >60)
GFR, EST AFRICAN AMERICAN: 5 mL/min — AB (ref >60)
Glucose, Bld: 86 mg/dL (ref 65–99)
Potassium: 5.4 mmol/L — ABNORMAL HIGH (ref 3.5–5.1)
Sodium: 140 mmol/L (ref 135–145)

## 2014-12-10 MED ORDER — DIPHENHYDRAMINE HCL 25 MG PO CAPS: 25.0000 mg | ORAL_CAPSULE | Freq: Every evening | ORAL | Status: DC | PRN

## 2014-12-10 MED ORDER — ALBUTEROL SULFATE (2.5 MG/3ML) 0.083% IN NEBU: 2.5000 mg | INHALATION_SOLUTION | RESPIRATORY_TRACT | Status: AC | PRN

## 2014-12-10 MED ORDER — NITROGLYCERIN 0.4 MG SL SUBL: 0.4000 mg | SUBLINGUAL_TABLET | SUBLINGUAL | Status: AC | PRN

## 2014-12-10 MED ORDER — MORPHINE SULFATE 20 MG/5ML PO SOLN: 5.0000 mg | ORAL | Status: AC | PRN

## 2014-12-10 MED ORDER — DIPHENHYDRAMINE HCL 25 MG PO CAPS
25.0000 mg | ORAL_CAPSULE | Freq: Once | ORAL | Status: AC
  Administered 2014-12-10: 25 mg via ORAL
  Filled 2014-12-10: qty 1

## 2014-12-10 MED ORDER — LORAZEPAM 1 MG PO TABS
1.0000 mg | ORAL_TABLET | ORAL | Status: AC | PRN
Start: 2014-12-10 — End: ?

## 2014-12-10 MED ORDER — METRONIDAZOLE 250 MG PO TABS: 250.0000 mg | ORAL_TABLET | Freq: Four times a day (QID) | ORAL | Status: AC

## 2014-12-10 MED ORDER — METRONIDAZOLE 500 MG PO TABS
250.0000 mg | ORAL_TABLET | Freq: Four times a day (QID) | ORAL | Status: DC
  Administered 2014-12-10: 250 mg via ORAL
  Filled 2014-12-10: qty 1

## 2014-12-10 NOTE — Clinical Social Work Note (Signed)
Patient to discharge today to the hospice home as arranged by Dr. Harvie JuniorPhifer and daughter of patient.  BrackenridgeMonica Mariadelcarmen Corella, MinnesotaMSW,LCSWA [phone]

## 2014-12-10 NOTE — Consult Note (Signed)
Palliative Medicine Inpatient Consult Follow Up Note   Name: Chris Howe Date: 12/10/2014 MRN: [id number]  DOB: 1941-07-24  Referring Physician: Altamese DillingVaibhavkumar Vachhani, MD  Palliative Care consult requested for this 74 y.o. male for goals of medical therapy in patient with ICM with EF < 20%, ESRD refused dialysis, COPD, ongoing tobacco use, CAD, PVD, pulmonary HTN admitted with A/CKD    Mr Chris Howe is sitting up in chair. Answers questions though lethargic. Family not present.   REVIEW OF SYSTEMS:  Patient is not able to provide ROS  CODE STATUS: DNR   PAST MEDICAL HISTORY: Past Medical History  Diagnosis Date  . CKD (chronic kidney disease), stage IV   . Chronic systolic CHF (congestive heart failure)     a. echo 01/2011: EF 15%, severe global HK,   . History of CVA (cerebrovascular accident)   . COPD (chronic obstructive pulmonary disease)   . HTN (hypertension)     PAST SURGICAL HISTORY: History reviewed. No pertinent past surgical history.  Vital Signs: BP 131/68 mmHg  Pulse 64  Temp(Src) 97.7 F (36.5 C) (Oral)  Resp 19  Ht 5\' 10"  (1.778 m)  Wt 54.432 kg (120 lb)  BMI 17.22 kg/m2  SpO2 100% Filed Weights   12/08/14 1543  Weight: 54.432 kg (120 lb)    Estimated body mass index is 17.22 kg/(m^2) as calculated from the following:   Height as of this encounter: 5\' 10"  (1.778 m).   Weight as of this encounter: 54.432 kg (120 lb).  PHYSICAL EXAM: Generall: critically ill-appearing HEENT: OP clear, poor dentition, hearing intact Neck: Trachea midline  Cardiovascular: regular rate and rhythm Pulmonary/Chest: fair air movement, no audible wheeze Abdominal: Soft, NTTP, + bowel sounds GU: No SP tenderness Extremities: + edema  Neurological: Grossly nonfocal Skin: no rashes Psychiatric: lethargic  LABS: CBC:    Component Value Date/Time   WBC 3.9 12/09/2014 0702   WBC 11.1* 11/01/2014 1914   HGB 10.9* 12/09/2014 0702   HGB 10.3* 11/01/2014 1914   HCT 35.6*  12/09/2014 0702   HCT 33.4* 11/01/2014 1914   PLT 119* 12/09/2014 0702   PLT 139* 11/01/2014 1914   MCV 87.4 12/09/2014 0702   MCV 89 11/01/2014 1914   NEUTROABS 10.0* 11/01/2014 1914   NEUTROABS 2.8 08/15/2013 0525   LYMPHSABS 0.3* 11/01/2014 1914   LYMPHSABS 0.8 08/15/2013 0525   MONOABS 0.7 11/01/2014 1914   MONOABS 0.7 08/15/2013 0525   EOSABS 0.0 11/01/2014 1914   EOSABS 0.1 08/15/2013 0525   BASOSABS 0.0 11/01/2014 1914   BASOSABS 0.0 08/15/2013 0525   Comprehensive Metabolic Panel:    Component Value Date/Time   NA 140 12/10/2014 0537   NA 140 11/01/2014 1914   K 5.4* 12/10/2014 0537   K 4.9 11/01/2014 1914   CL 103 12/10/2014 0537   CL 103 11/01/2014 1914   CO2 22 12/10/2014 0537   CO2 24 11/01/2014 1914   BUN 142* 12/10/2014 0537   BUN 123* 11/01/2014 1914   CREATININE 10.03* 12/10/2014 0537   CREATININE 7.43* 11/01/2014 1914   GLUCOSE 86 12/10/2014 0537   GLUCOSE 102* 11/01/2014 1914   CALCIUM 8.3* 12/10/2014 0537   CALCIUM 8.8* 11/01/2014 1914   AST 20 09/04/2014 1428   AST 19 08/15/2013 0525   ALT 12* 09/04/2014 1428   ALT 30 08/15/2013 0525   ALKPHOS 56 09/04/2014 1428   ALKPHOS 86 08/15/2013 0525   BILITOT 0.3 08/15/2013 0525   PROT 3.6* 09/04/2014 1428   PROT 5.7* 08/15/2013  [id number]   ALBUMIN 1.4* 09/04/2014 1428   ALBUMIN 2.1* 08/15/2013 0525    IMPRESSION: Mr Chris Howe is a 74 yo man with PMH of ICM with EF < 20% (ECHO 01/2014), ESRD refused dialysis, COPD, ongoing tobacco use, CAD, NSTEMI (01/2010), PVD, 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 He was admitted 12/08/14 with weakness, decreased po and facial swelling. Work-up significant for A/CKD which continues to worsen. Pt also being treated for C.diff.   I spoke with pt's daughter, Chris Howe. Updated her on pt's current condition. Explained that pt could not return to ALF due to C.diff. We discussed transfer to the Hospice Home as pt appears to be approaching the end of  life. Daughter in agreement. Hospice Home liason notified and will see pt.  PLAN: Hospice Home  REFERRALS TO BE ORDERED:  Hospice   More than 50% of the visit was spent in counseling/coordination of care: YES  Time spent: 35 minutes

## 2014-12-10 NOTE — Progress Notes (Signed)
Patient discharged to [facility]osp[facility] via EMS in stable condition.  Patient's aunt, Alvis LemmingsDawn, notified by RN per her request.  IV removed, patient tolerated well.  Belongings (necklace and two rings) retrieved from safe by security and given to patient prior to leaving hospital.

## 2014-12-10 NOTE — Clinical Social Work Note (Signed)
Patient is CDiff positive and CSW had [facility] and she states that they will not be able to accept patient back due to this. Dr. Harvie JuniorPhifer to speak with patient's daughter about the possibility of transferring patient to the hospice home.  York SpanielMonica Melodee Lupe MSW,LCSWA [phone]

## 2014-12-10 NOTE — Progress Notes (Signed)
Subjective:   Siting up eating lunch when seen. No acute c/o From - [facility]  Objective:  Vital signs in last 24 hours:  Temp:  [97.3 F (36.3 C)-97.7 F (36.5 C)] 97.3 F (36.3 C) (05/10 1151) Pulse Rate:  [64] 64 (05/10 1151) Resp:  [19] 19 (05/10 1151) BP: (124-131)/(68-82) 124/72 mmHg (05/10 1151) SpO2:  [98 %-100 %] 98 % (05/10 1151)  Weight change:  Filed Weights   12/08/14 1543  Weight: 54.432 kg (120 lb)    Intake/Output: I/O last 3 completed shifts: In: 90 [P.O.:90] Out: 250 [Urine:250]   Intake/Output this shift:     Physical Exam: General: NAD,   Head: Normocephalic, atraumatic. Moist oral mucosal membranes  Eyes: Anicteric,  Neck: Supple, trachea midline  Lungs:  Clear to auscultation  Heart: Regular rate and rhythm  Abdomen:  Soft, nontender,   Extremities:  ++ peripheral edema.  Neurologic: Nonfocal, moving all four extremities  Skin: No lesions  Access: none    Basic Metabolic Panel:  Recent Labs Lab 12/08/14 1540 12/08/14 2205 12/09/14 0702 12/09/14 2038 12/10/14 0537  NA 138  --  142  --  140  K 6.0*  --  5.5* 5.3* 5.4*  CL 100*  --  102  --  103  CO2 20*  --  21*  --  22  GLUCOSE 122*  --  80  --  86  BUN 123*  --  132*  --  142*  CREATININE 9.57* 9.64* 9.67*  --  10.03*  CALCIUM 9.1  --  8.9  --  8.3*    Liver Function Tests: No results for input(s): AST, ALT, ALKPHOS, BILITOT, PROT, ALBUMIN in the last 168 hours. No results for input(s): LIPASE, AMYLASE in the last 168 hours. No results for input(s): AMMONIA in the last 168 hours.  CBC:  Recent Labs Lab 12/08/14 1540 12/08/14 2205 12/09/14 0702  WBC 4.3 4.5 3.9  HGB 12.1* 11.8* 10.9*  HCT 39.7* 38.2* 35.6*  MCV 88.1 89.0 87.4  PLT 150 134* 119*    Cardiac Enzymes:  Recent Labs Lab 12/09/14 0730  TROPONINI 0.21*    BNP: Invalid input(s): POCBNP  CBG:  Recent Labs Lab 12/08/14 1927  GLUCAP 98    Microbiology: Results for orders  placed or performed during the hospital encounter of 12/08/14  C difficile quick scan w PCR reflex [facility])     Status: None   Collection Time: 12/09/14  9:47 AM  Result Value Ref Range Status   C Diff antigen POSITIVE  Final   C Diff toxin NEGATIVE  Final   C Diff interpretation   Final    Positive for toxigenic C. difficile, active toxin production not detected. Patient has toxigenic C. difficile organisms present in the bowel, but toxin was not detected. The patient may be a carrier or the level of toxin in the sample was below the limit  of detection. This information should be used in conjunction with the patient's clinical history when deciding on possible therapy.     Comment: CRITICAL RESULT CALLED TO, READ BACK BY AND VERIFIED WITH: GINA DENTON @ 1500 12/09/14 BGB   Clostridium Difficile by PCR     Status: Abnormal   Collection Time: 12/09/14  9:47 AM  Result Value Ref Range Status   C difficile by pcr POSITIVE (A) NEGATIVE Final    Coagulation Studies: No results for input(s): LABPROT, INR in the last 72 hours.  Urinalysis: No results for input(s): COLORURINE,  LABSPEC, PHURINE, GLUCOSEU, HGBUR, BILIRUBINUR, KETONESUR, PROTEINUR, UROBILINOGEN, NITRITE, LEUKOCYTESUR in the last 72 hours.  Invalid input(s): APPERANCEUR    Imaging: No results found.   Medications:     . aspirin  81 mg Oral Daily  . budesonide-formoterol  2 puff Inhalation BID  . carvedilol  25 mg Oral BID WC  . feeding supplement (NEPRO CARB STEADY)  237 mL Oral BID BM  . furosemide  40 mg Oral BID  . gabapentin  300 mg Oral QHS  . heparin  5,000 Units Subcutaneous 3 times per day  . hydrALAZINE  100 mg Oral TID  . isosorbide mononitrate  90 mg Oral Daily  . loratadine  10 mg Oral Daily  . metroNIDAZOLE  250 mg Oral 4 times per day  . temazepam  15 mg Oral QHS   acetaminophen **OR** acetaminophen, albuterol, diphenhydrAMINE, nitroGLYCERIN, ondansetron **OR** ondansetron (ZOFRAN) IV,  traMADol  Assessment/ Plan:  74 year old [place] male with past medical history of congestive heart failure EF 15-20%, hypertension, CKD stage IV seconary to polycystic kidney disease, peripheral vascular disease  1. ARF on CKD st 4 probably ESRD - due to poor functional status and severe CHF patient's life expectancy is is less than 1 year - not good candidate for dialysis overall.  - patient has declined to go on dialysis in the past - discussed with palliative care team, Dr Phifer  2. Peripheral edema - agree with lasix for now 3. C Diff positive.  Treatment as per primary team 4. Hyperkalemia - Low k Diet     LOS: 2 Liyat Faulkenberry 5/10/20164:56 PM

## 2014-12-10 NOTE — Progress Notes (Signed)
Visit made. Patient seen sitting up in the recliner, eyes closed. Lunch tray at bedside, patient had eaten only bites. Writer spoke with Dr. Harvie JuniorPhifer, who feels that patient is nearing end of lIfe. Dr. Harvie JuniorPhifer has spoken to both the patient and his daughter Chris Howe, both are in agreement with transfer to the hospice home. Writer also spoke with patient who confirmed agreement. Writer contacted patient's daughter Chris Howe [phone]), services explained with good understanding voiced by Chris Howe.. Consents to be faxed to [phone]). Chris Howe agrees to sign faxed consents. Report called to Hospice Home updated information faxed to referral intake. Attending Physician contacted for discharge order. MD,CM, CSW and RN all aware of plan for patient to discharge today via EMS with portable DNR to the Hospice Home.EMS notified.  Thank you Dayna BarkerKaren Robertson RN, BSN, [facility] [place]. [phone]

## 2014-12-10 NOTE — Consult Note (Signed)
Hospice Home Discharge Orders   Patient:  Chris BalintJohn W Howe is an 74 y.o., male MRN:  [id number] DOB:  1941/03/21 Patient phone:  [phone] (home)  Patient address[place]   [address] [place] [id number],     Admit: Admit to Hospice Home  Code Status: DNR  Diet: as tolerated  Activity: as tolerated  Oxygen: use for comfort  Foley: Leave foley cath for urinary incontinence/previous skin breakdown   Date of Admission:  12/08/2014   Principal Problem: Hyperkalemia Discharge Diagnoses: Patient Active Problem List   Diagnosis Date Noted  . Hyperkalemia [E87.5] 12/08/2014  . ESRD (end stage renal disease) [N18.6] 12/08/2014  . Systolic CHF, chronic [I50.22] 12/08/2014  . Chronic systolic CHF (congestive heart failure), NYHA class 3 [I50.22] 09/12/2012  . Dizziness [R42] 11/04/2010  . Hyperlipidemia [E78.5] 11/04/2010  . Smoking [Z72.0] 11/04/2010  . CARDIOMYOPATHY, ISCHEMIC [I25.89] 02/19/2010  . 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 DYSPNEA [R06.02] 01/07/2010  . ABNORMAL EKG [R94.31] 01/07/2010      Medications: May crush or use liquid when appropriate. May change to rectal route if unable to swallow.    Medication List    STOP taking these medications        aspirin 81 MG tablet     atorvastatin 40 MG tablet  Commonly known as:  LIPITOR     budesonide-formoterol 80-4.5 MCG/ACT inhaler  Commonly known as:  SYMBICORT     carvedilol 25 MG tablet  Commonly known as:  COREG     gabapentin 300 MG capsule  Commonly known as:  NEURONTIN     hydrALAZINE 100 MG tablet  Commonly known as:  APRESOLINE     loratadine 10 MG tablet  Commonly known as:  CLARITIN     PLAVIX 75 MG tablet  Generic drug:  clopidogrel     traMADol 50 MG tablet  Commonly known as:  ULTRAM      TAKE these medications        albuterol (2.5 MG/3ML) 0.083% nebulizer solution  Commonly known as:  PROVENTIL  Take 3 mLs (2.5 mg total) by nebulization every 2 (two) hours as  needed for wheezing.     furosemide 40 MG tablet  Commonly known as:  LASIX  Take 1 tablet (40 mg total) by mouth daily.     isosorbide mononitrate 30 MG 24 hr tablet  Commonly known as:  IMDUR  Take 90 mg by mouth daily.     LORazepam 1 MG tablet  Commonly known as:  ATIVAN  Take 1 tablet (1 mg total) by mouth every hour as needed for anxiety.     metroNIDAZOLE 250 MG tablet  Commonly known as:  FLAGYL  Take 1 tablet (250 mg total) by mouth every 6 (six) hours.     morphine 20 MG/5ML solution  Take 1.3 mLs (5.2 mg total) by mouth every hour as needed for pain.     nitroGLYCERIN 0.4 MG SL tablet  Commonly known as:  NITROSTAT  Place 1 tablet (0.4 mg total) under the tongue every 5 (five) minutes as needed (for chest pain.).     temazepam 15 MG capsule  Commonly known as:  RESTORIL  Take 15 mg by mouth at bedtime. Take every night per MAR           Comments:    Signed: Steele Bergancy W Jonus Coble 12/10/2014, 12:00 PM

## 2014-12-10 NOTE — Discharge Summary (Signed)
[facility] [place] [facility]   PATIENT NAME: Chris Howe    MR#:  [id number]  DATE OF BIRTH:  [birth year]  DATE OF ADMISSION:  12/08/2014 ADMITTING PHYSICIAN: Shaune Pollack, MD  DATE OF DISCHARGE: 12/10/14  ADMISSION DIAGNOSIS:  Hyperkalemia [E87.5] Peripheral edema [R60.9]  DISCHARGE DIAGNOSIS:  Principal Problem:   Hyperkalemia Active Problems:   ESRD (end stage renal disease)   Systolic CHF, chronic    C diff.  SECONDARY DIAGNOSIS:   Past Medical History  Diagnosis Date  . CKD (chronic kidney disease), stage IV   . Chronic systolic CHF (congestive heart failure)     a. echo 01/2011: EF 15%, severe global HK,   . History of CVA (cerebrovascular accident)   . COPD (chronic obstructive pulmonary disease)   . HTN (hypertension)     HOSPITAL COURSE:   * C diff colitis- Flagyl, Enteric precausion * Hyperkalemia. The patient was treated with the insulin and D50, lower potassium level today. * ESRD. The patient is not on dialysis. nephrology consult. He don't want to get HD- palliative care consult.     Pt and family agreed on hospice home discharge as he is approaching end of life. * Chronic systolic CHF with ejection fraction 20%. Increase lasix. * COPD. Stable. * Hypertension.Continue patient's home hypertension medications. * decreased oral intake- palliative care to help, Pt was already in hospice care   DISCHARGE CONDITIONS:   stable  CONSULTS OBTAINED:  Treatment Team:  Mosetta Pigeon, MD   nancy Phifer.  DRUG ALLERGIES:   Allergies  Allergen Reactions  . Furosemide     REACTION: pt states he thinks he is allergic because his eye itches but he still takes the furosemide    DISCHARGE MEDICATIONS:   Current Discharge Medication List    START taking these medications   Details  albuterol (PROVENTIL) (2.5 MG/3ML) 0.083% nebulizer solution Take 3 mLs (2.5 mg total) by nebulization every 2 (two) hours as needed for  wheezing. Qty: 75 mL, Refills: 12    LORazepam (ATIVAN) 1 MG tablet Take 1 tablet (1 mg total) by mouth every hour as needed for anxiety. Qty: 30 tablet, Refills: 0    metroNIDAZOLE (FLAGYL) 250 MG tablet Take 1 tablet (250 mg total) by mouth every 6 (six) hours. Qty: 30 tablet, Refills: 0    morphine 20 MG/5ML solution Take 1.3 mLs (5.2 mg total) by mouth every hour as needed for pain. Qty: 30 mL, Refills: 0      CONTINUE these medications which have CHANGED   Details  nitroGLYCERIN (NITROSTAT) 0.4 MG SL tablet Place 1 tablet (0.4 mg total) under the tongue every 5 (five) minutes as needed (for chest pain.). Qty: 30 tablet, Refills: 0      CONTINUE these medications which have NOT CHANGED   Details  isosorbide mononitrate (IMDUR) 30 MG 24 hr tablet Take 90 mg by mouth daily.     temazepam (RESTORIL) 15 MG capsule Take 15 mg by mouth at bedtime. Take every night per MAR    furosemide (LASIX) 40 MG tablet Take 1 tablet (40 mg total) by mouth daily. Qty: 30 tablet, Refills: 6      STOP taking these medications     aspirin 81 MG tablet      budesonide-formoterol (SYMBICORT) 80-4.5 MCG/ACT inhaler      carvedilol (COREG) 25 MG tablet      gabapentin (NEURONTIN) 300 MG capsule      hydrALAZINE (APRESOLINE) 100 MG tablet  loratadine (CLARITIN) 10 MG tablet      traMADol (ULTRAM) 50 MG tablet      atorvastatin (LIPITOR) 40 MG tablet      PLAVIX 75 MG tablet          DISCHARGE INSTRUCTIONS:    Diet- renal Activities- as tolerated.  If you experience worsening of your admission symptoms, develop shortness of breath, life threatening emergency, suicidal or homicidal thoughts you must seek medical attention immediately by calling 911 or calling your MD immediately  if symptoms less severe.  You Must read complete instructions/literature along with all the possible adverse reactions/side effects for all the Medicines you take and that have been prescribed to you.  Take any new Medicines after you have completely understood and accept all the possible adverse reactions/side effects.   Please note  You were cared for by a hospitalist during your hospital stay. If you have any questions about your discharge medications or the care you received while you were in the hospital after you are discharged, you can call the unit and asked to speak with the hospitalist on call if the hospitalist that took care of you is not available. Once you are discharged, your primary care physician will handle any further medical issues. Please note that NO REFILLS for any discharge medications will be authorized once you are discharged, as it is imperative that you return to your primary care physician (or establish a relationship with a primary care physician if you do not have one) for your aftercare needs so that they can reassess your need for medications and monitor your lab values.    Today   CHIEF COMPLAINT:   Chief Complaint  Patient presents with  . Edema  . Per facility pt refusing to eat and take meds    Per facility pt refusing to eat and take meds    HISTORY OF PRESENT ILLNESS:  Chris Howe  is a 74 y.o. male with a known history of CHF ejection fraction 20%, hypertension, ESRD and COPD. Patient was sent to ED from rest to home due to generalized weakness and dizziness. Patient is a alert awake and oriented. Patient has a chronic leg edema, which has been worsening for the past a few days. In addition, patient has a generalized weakness, dizziness and poor oral intake. Patient denies any cough, shortness breasts or sputum. He was found to have a high potassium is 6.0 ED, and was treated with the insulin and the D50. The patient is in hospice care. He is not on hemodialysis.  VITAL SIGNS:  Blood pressure 124/72, pulse 64, temperature 97.3 F (36.3 C), temperature source Oral, resp. rate 19, height 5\' 10"  (1.778 m), weight 54.432 kg (120 lb), SpO2 98 %.  I/O:    Intake/Output Summary (Last 24 hours) at 12/10/14 1324 Last data filed at 12/10/14 1155  Gross per 24 hour  Intake     30 ml  Output    200 ml  Net   -170 ml    PHYSICAL EXAMINATION:   GENERAL: 74 y.o.-year-old patient lying in the bed with no acute distress.  EYES: Pupils equal, round, reactive to light and accommodation. No scleral icterus. Extraocular muscles intact.  HEENT: Head atraumatic, normocephalic. Oropharynx and nasopharynx clear.  NECK: Supple, no jugular venous distention. No thyroid enlargement, no tenderness.  LUNGS: Normal breath sounds bilaterally, no wheezing, rales,rhonchi or crepitation. No use of accessory muscles of respiration.  CARDIOVASCULAR: S1, S2 normal. No murmurs, rubs, or gallops.  ABDOMEN: Soft, nontender, nondistended. Bowel sounds present. No organomegaly or mass.  EXTREMITIES: No pedal edema, cyanosis, or clubbing.  NEUROLOGIC: Cranial nerves II through XII are intact. Muscle strength 4/5 in all extremities. Sensation intact. Gait not checked.  PSYCHIATRIC: The patient is alert and oriented x 3.  SKIN: No obvious rash, lesion, or ulcer DATA REVIEW:   CBC  Recent Labs Lab 12/09/14 0702  WBC 3.9  HGB 10.9*  HCT 35.6*  PLT 119*    Chemistries   Recent Labs Lab 12/10/14 0537  NA 140  K 5.4*  CL 103  CO2 22  GLUCOSE 86  BUN 142*  CREATININE 10.03*  CALCIUM 8.3*    Cardiac Enzymes  Recent Labs Lab 12/09/14 0730  TROPONINI 0.21*    Microbiology Results  Results for orders placed or performed during the hospital encounter of 12/08/14  C difficile quick scan w PCR reflex [facility]     Status: None   Collection Time: 12/09/14  9:47 AM  Result Value Ref Range Status   C Diff antigen POSITIVE  Final   C Diff toxin NEGATIVE  Final   C Diff interpretation   Final    Positive for toxigenic C. difficile, active toxin production not detected. Patient has toxigenic C. difficile organisms present in the bowel, but toxin  was not detected. The patient may be a carrier or the level of toxin in the sample was below the limit  of detection. This information should be used in conjunction with the patient's clinical history when deciding on possible therapy.     Comment: CRITICAL RESULT CALLED TO, READ BACK BY AND VERIFIED WITH: GINA DENTON @ 1500 12/09/14 BGB   Clostridium Difficile by PCR     Status: Abnormal   Collection Time: 12/09/14  9:47 AM  Result Value Ref Range Status   C difficile by pcr POSITIVE (A) NEGATIVE Final    Management plans discussed with the patient, family and they are in agreement.  CODE STATUS: DNR  TOTAL TIME TAKING CARE OF THIS PATIENT: 40 minutes.    Altamese DillingVACHHANI, Golda Zavalza M.D on 12/10/2014 at 1:24 PM  Between 7am to 6pm - Pager - [phone]  After 6pm go to [email or web address] - password [email or web address] [facility]  [facility]  Office  [phone]  CC: Primary care physician; PROVIDER NOT IN SYSTEM

## 2014-12-10 NOTE — Progress Notes (Signed)
[facility] [place] [facility]   PATIENT NAME: Chris ShapeJohn Howe    MR#:  [id number]  DATE OF BIRTH:  1940-09-27  SUBJECTIVE:  CHIEF COMPLAINT:   Chief Complaint  Patient presents with  . Edema  . Per facility pt refusing to eat and take meds    Per facility pt refusing to eat and take meds    REVIEW OF SYSTEMS:  CONSTITUTIONAL: No fever, have fatigue or weakness.  EYES: No blurred or double vision.  EARS, NOSE, AND THROAT: No tinnitus or ear pain.  RESPIRATORY: No cough, shortness of breath, wheezing or hemoptysis.  CARDIOVASCULAR: No chest pain, orthopnea, edema.  GASTROINTESTINAL: No nausea, vomiting, have diarrhea but no abdominal pain.  GENITOURINARY: No dysuria, hematuria.  ENDOCRINE: No polyuria, nocturia,  HEMATOLOGY: No anemia, easy bruising or bleeding SKIN: No rash or lesion. MUSCULOSKELETAL: No joint pain or arthritis.   NEUROLOGIC: No tingling, numbness,have generalized weakness.  PSYCHIATRY: No anxiety or depression.   ROS  DRUG ALLERGIES:   Allergies  Allergen Reactions  . Furosemide     REACTION: pt states he thinks he is allergic because his eye itches but he still takes the furosemide    VITALS:  Blood pressure 131/68, pulse 64, temperature 97.7 F (36.5 C), temperature source Oral, resp. rate 19, height 5\' 10"  (1.778 m), weight 54.432 kg (120 lb), SpO2 100 %.  PHYSICAL EXAMINATION:  GENERAL:  74 y.o.-year-old patient lying in the bed with no acute distress.  EYES: Pupils equal, round, reactive to light and accommodation. No scleral icterus. Extraocular muscles intact.  HEENT: Head atraumatic, normocephalic. Oropharynx and nasopharynx clear.  NECK:  Supple, no jugular venous distention. No thyroid enlargement, no tenderness.  LUNGS: Normal breath sounds bilaterally, no wheezing, rales,rhonchi or crepitation. No use of accessory muscles of respiration.  CARDIOVASCULAR: S1, S2 normal. No murmurs, rubs, or gallops.  ABDOMEN:  Soft, nontender, nondistended. Bowel sounds present. No organomegaly or mass.  EXTREMITIES: No pedal edema, cyanosis, or clubbing.  NEUROLOGIC: Cranial nerves II through XII are intact. Muscle strength 4/5 in all extremities. Sensation intact. Gait not checked.  PSYCHIATRIC: The patient is alert and oriented x 3.  SKIN: No obvious rash, lesion, or ulcer.   Physical Exam LABORATORY PANEL:   CBC  Recent Labs Lab 12/09/14 0702  WBC 3.9  HGB 10.9*  HCT 35.6*  PLT 119*   ------------------------------------------------------------------------------------------------------------------  Chemistries   Recent Labs Lab 12/10/14 0537  NA 140  K 5.4*  CL 103  CO2 22  GLUCOSE 86  BUN 142*  CREATININE 10.03*  CALCIUM 8.3*   ------------------------------------------------------------------------------------------------------------------  Cardiac Enzymes  Recent Labs Lab 12/09/14 0730  TROPONINI 0.21*   ------------------------------------------------------------------------------------------------------------------  RADIOLOGY:  No results found.  EKG:   Orders placed or performed during the hospital encounter of 12/08/14  . ED EKG  . ED EKG  . EKG 12-Lead  . EKG 12-Lead    ASSESSMENT AND PLAN:   * C diff colitis- Flagyl, Enteric precausion * Hyperkalemia. The patient was treated with the insulin and D50, lower potassium level today. * ESRD. The patient is not on dialysis. nephrology consult. He don't want to get HD- palliative care consult. * Chronic systolic CHF with ejection fraction 20%. Increase lasix. * COPD. Stable. * Hypertension.Continue patient's home hypertension medications. * decreased oral intake- palliative care to help, Pt was already in hospice care.    All the records are reviewed and case discussed with Care Management/Social Workerr. Management plans discussed with the  patient, family and they are in agreement.  CODE STATUS: DNR TOTAL  TIME TAKING CARE OF THIS PATIENT: 30 minutes.   POSSIBLE D/C IN 1-2 DAYS, DEPENDING ON CLINICAL CONDITION.   Altamese DillingVACHHANI, Catalena Stanhope M.D on 12/10/2014 at 10:15 AM  Between 7am to 6pm - Pager - [phone]  After 6pm go to [email or web address] - password [email or web address] [facility]  [facility]  Office  [phone]  CC: Primary care physician; PROVIDER NOT IN SYSTEM

## 2015-01-01 DEATH — deceased

## 2015-07-25 IMAGING — CR DG CHEST 2V
1 series · 3 of 3 positions shown · non-contrast
Comparison: Chest x-ray 06/25/2013.  Chest CT 05/26/2013.

CLINICAL DATA: Cough and shortness of breath.

EXAM:
CHEST  2 VIEW

[Series 1: pa · 0.17mm/px · 3 of 3 slices shown]
[im 1/3]
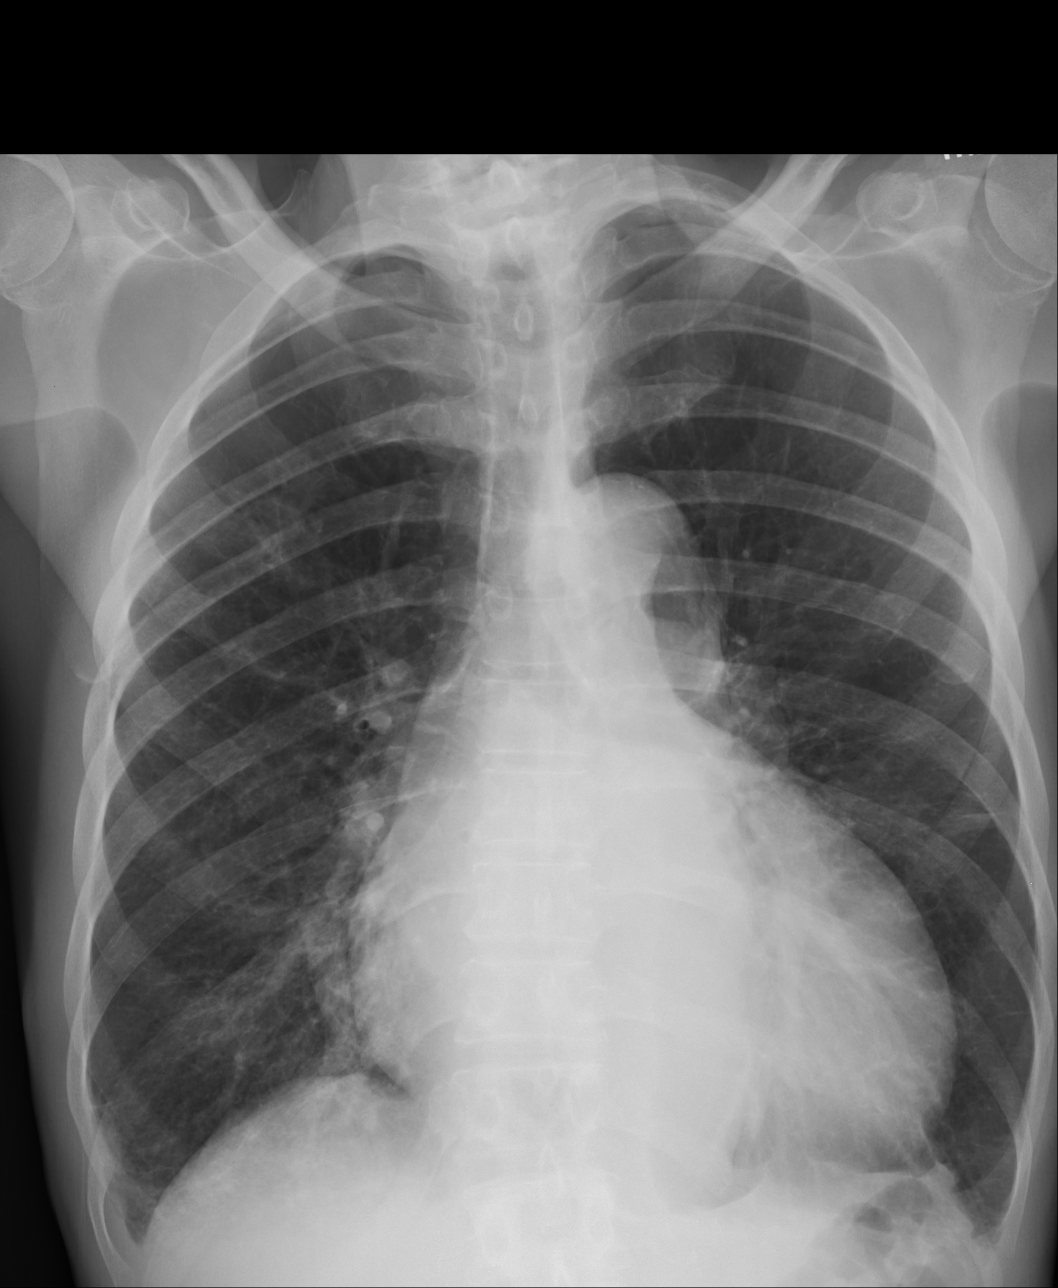
[im 2/3]
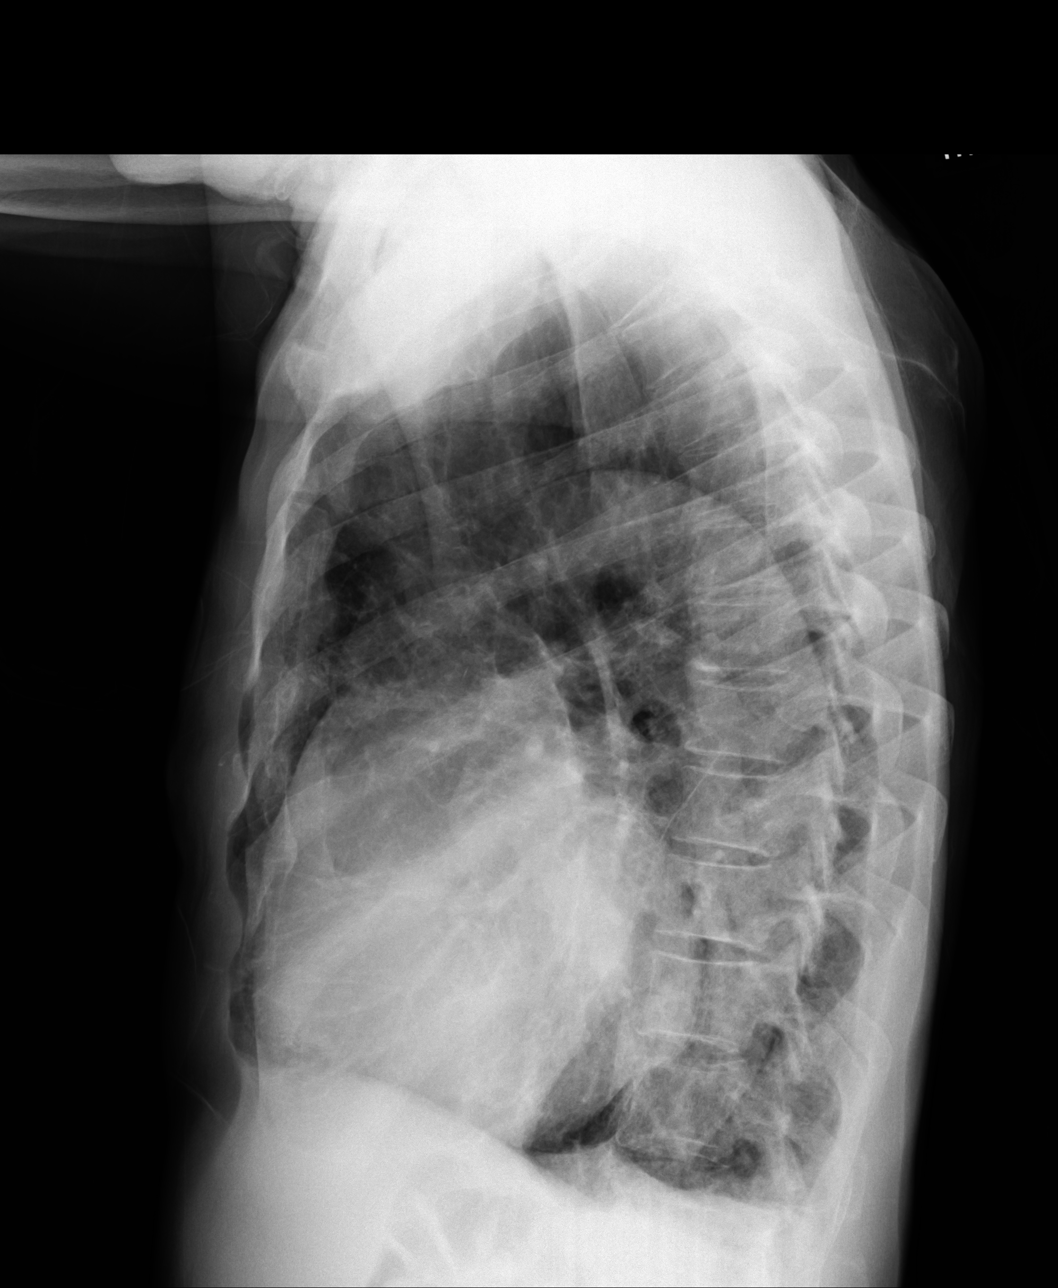
[im 3/3]
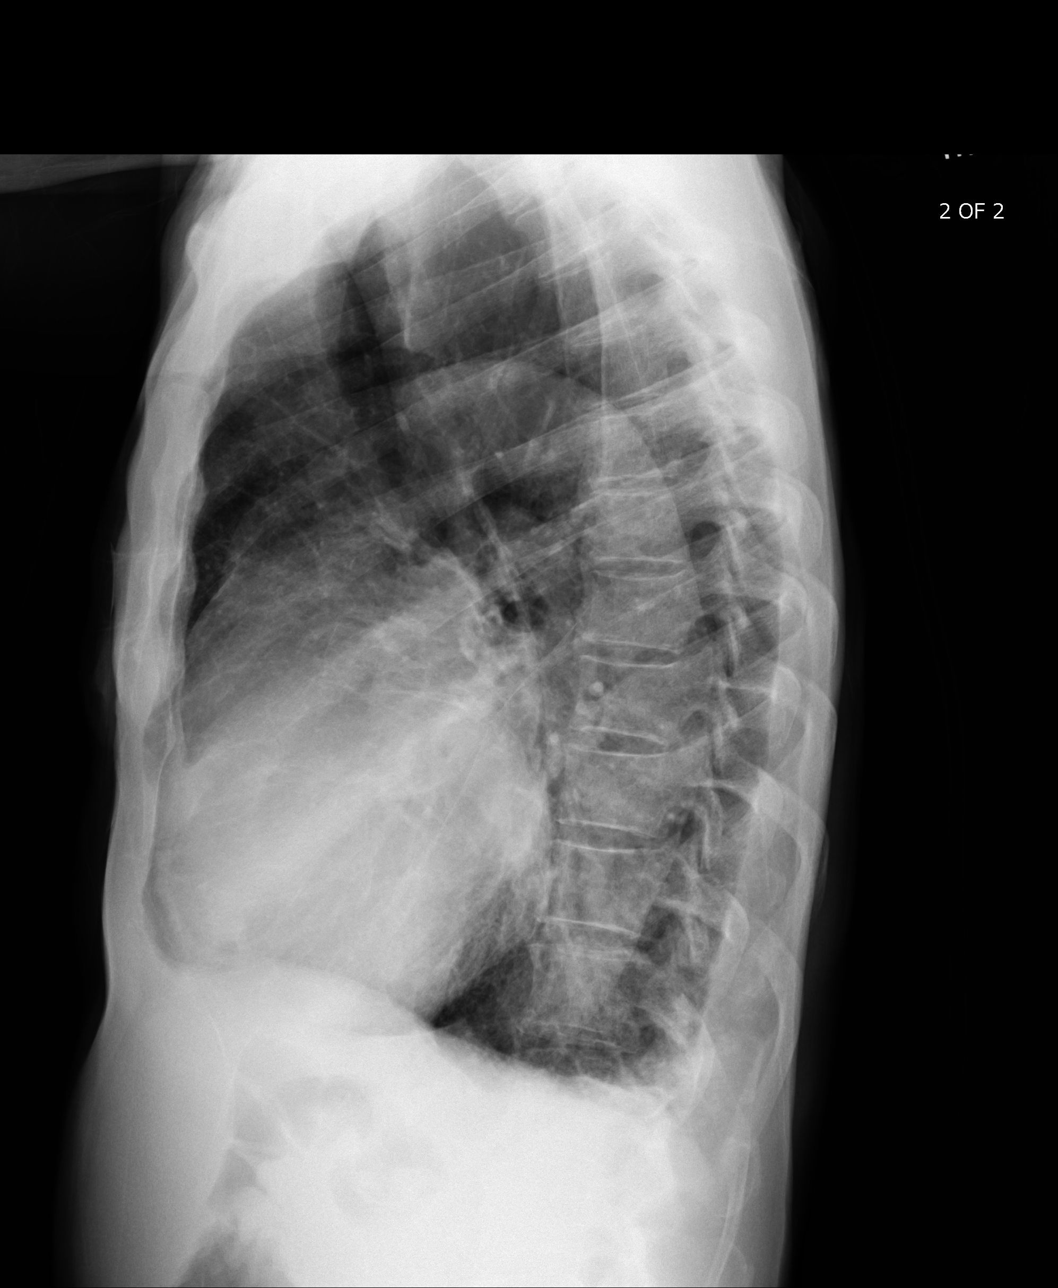

[3 of 3 positions shown; findings below may reference images not displayed]

FINDINGS: Pulmonary nodule in the right upper lung is again noted, is better
seen with chest CT which demonstrated 1.5 cm pleural based nodule
and PET-CT was recommended for further evaluation. Stable
cardiomegaly. Pulmonary vascularity is normal. Mild basilar pleural
thickening and/or tiny effusions with mild basilar atelectasis. No
pneumothorax. No acute bony abnormality.
IMPRESSION: 1. Nodular density in the right upper lung is again noted. CT better
demonstrates this finding. CT of 05/26/2013 revealed a 1.5 cm
pleural based pulmonary nodule for which PET-CT was recommended .
2. Mild basilar atelectasis. Small basilar pleural effusions cannot
be excluded.
3. Stable cardiomegaly. Given the degree of cardiac enlargement, a
process such is pericardial effusion, cardiomyopathy, or valvular
disease could not be excluded. Cardiac ECHO may prove useful for
further evaluation.

## 2015-07-27 IMAGING — CR DG CHEST 2V
1 series · 3 of 3 positions shown · non-contrast
Comparison: 08/03/2013

CLINICAL DATA: Shortness of breath.

EXAM:
CHEST  2 VIEW

[Series 2: x chest ap · 0.14mm/px · 3 of 3 slices shown]
[im 1/3]
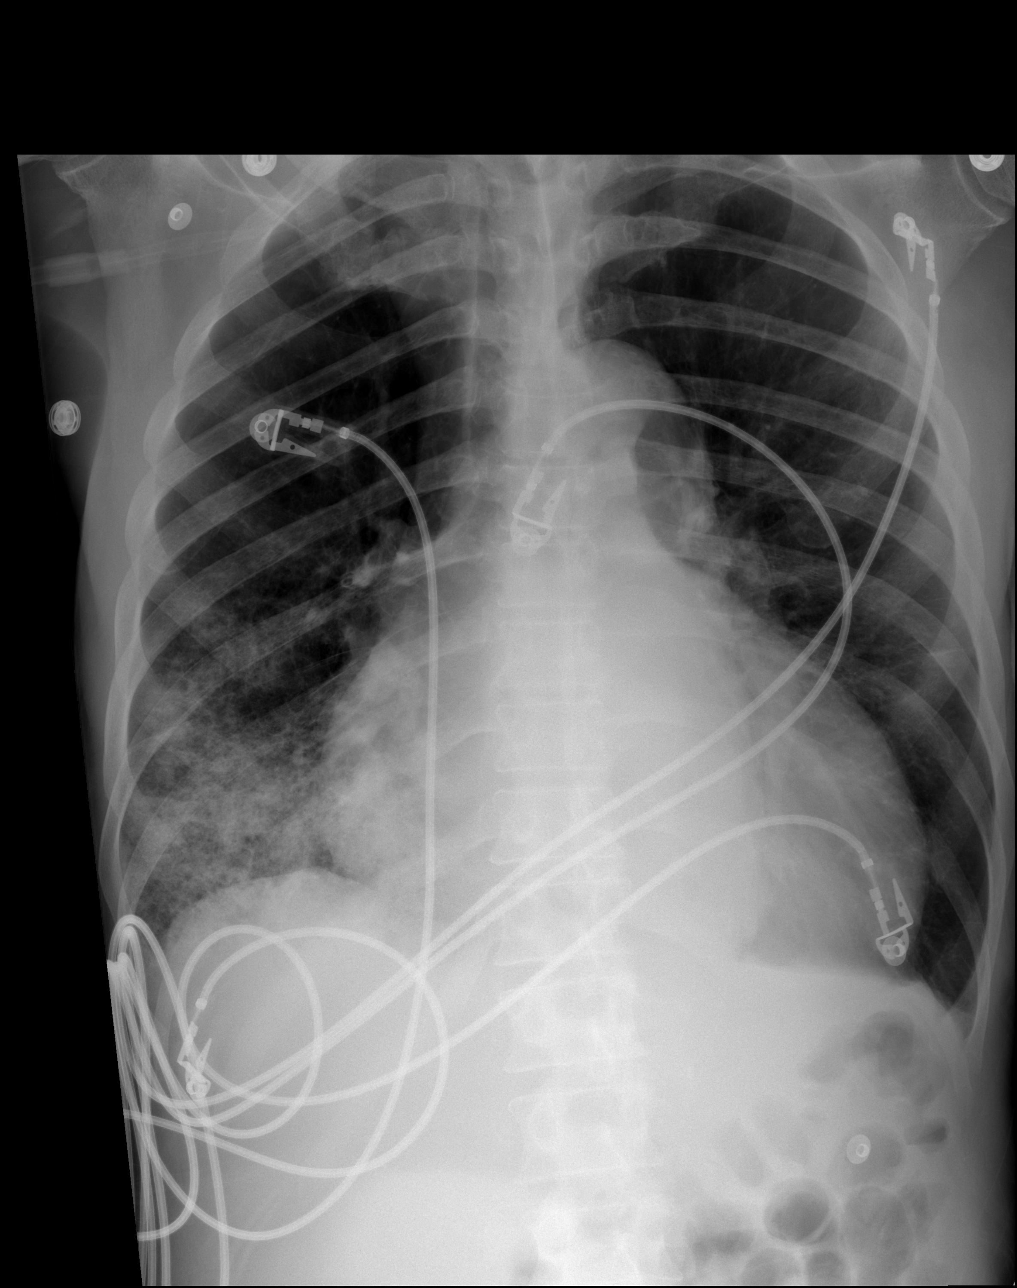
[im 2/3]
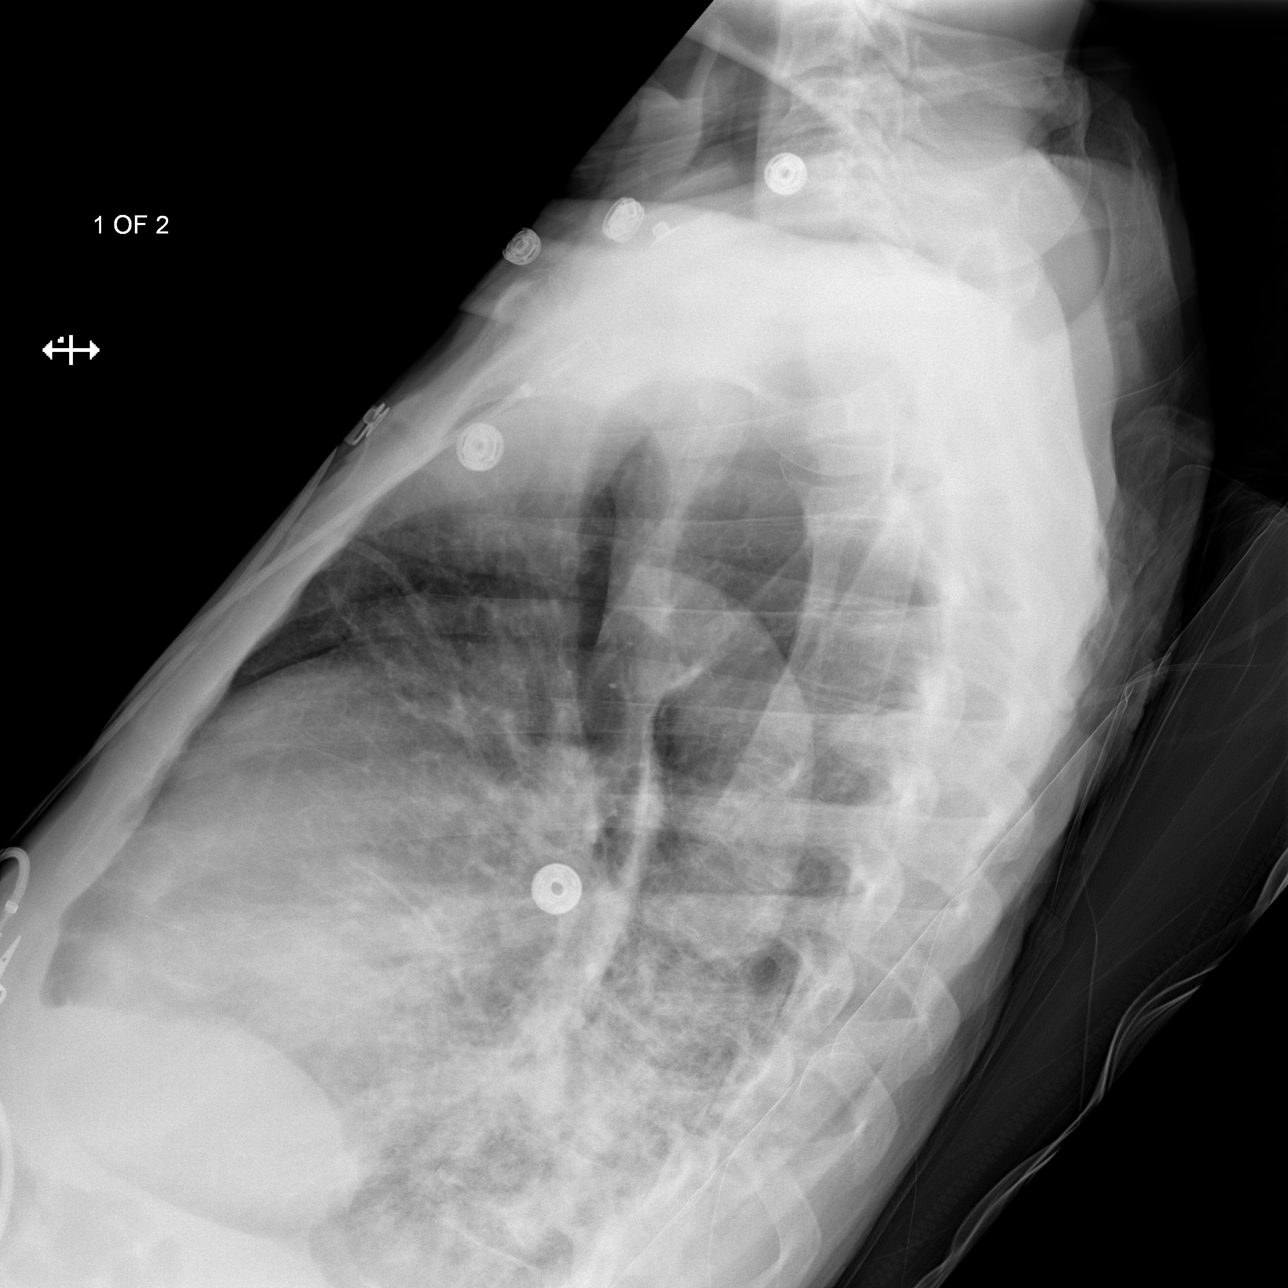
[im 3/3]
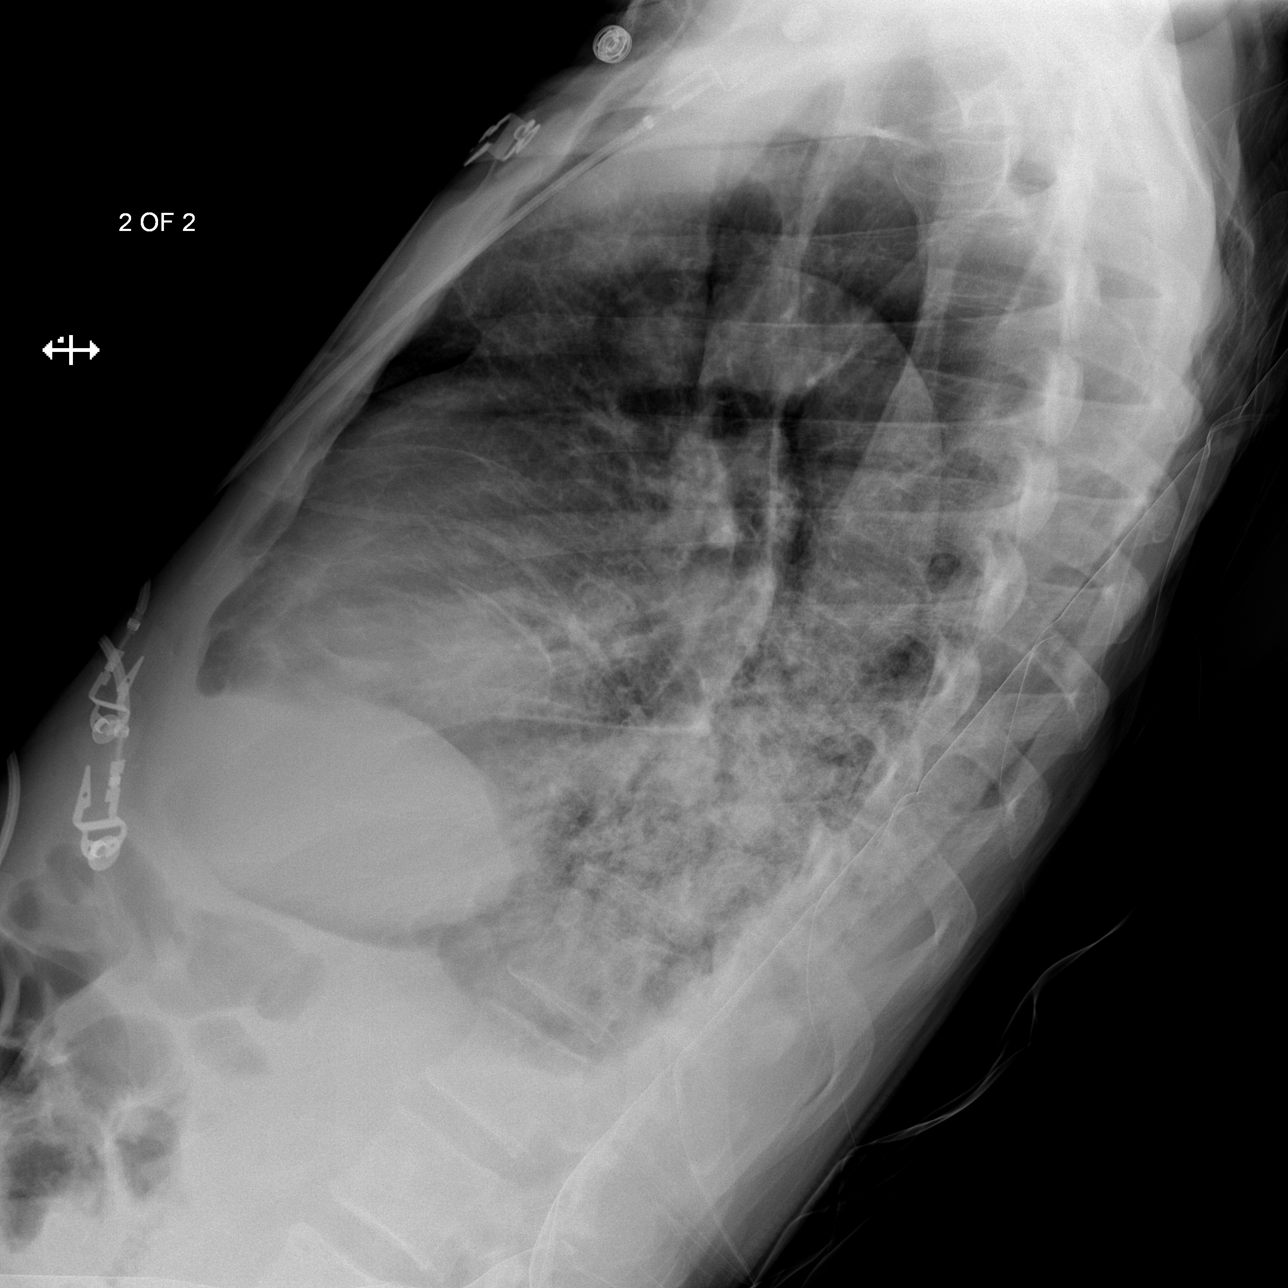

[3 of 3 positions shown; findings below may reference images not displayed]

FINDINGS: The patient has developed patchy airspace densities in the right
lower lung. Findings are most compatible with pneumonia or
aspiration. Lucency in the upper lungs are consistent with
emphysema. There is stable cardiomegaly. Patient has a known
pleural-based nodule in the posterior upper chest which is not well
characterized on these radiographs.
IMPRESSION: Patchy airspace disease in the right lower lung. Findings are most
compatible with pneumonia or aspiration.

Cardiomegaly.

## 2016-02-05 IMAGING — CR DG ABDOMEN 2V
1 series · 2 of 2 positions shown · non-contrast
Comparison: CT 08/03/2013

CLINICAL DATA: Abdominal swelling 1 week with diarrhea.

EXAM:
ABDOMEN - 2 VIEW

[Series 1: w abdomen upright · 0.14mm/px · 2 of 2 slices shown]
[im 1/2]
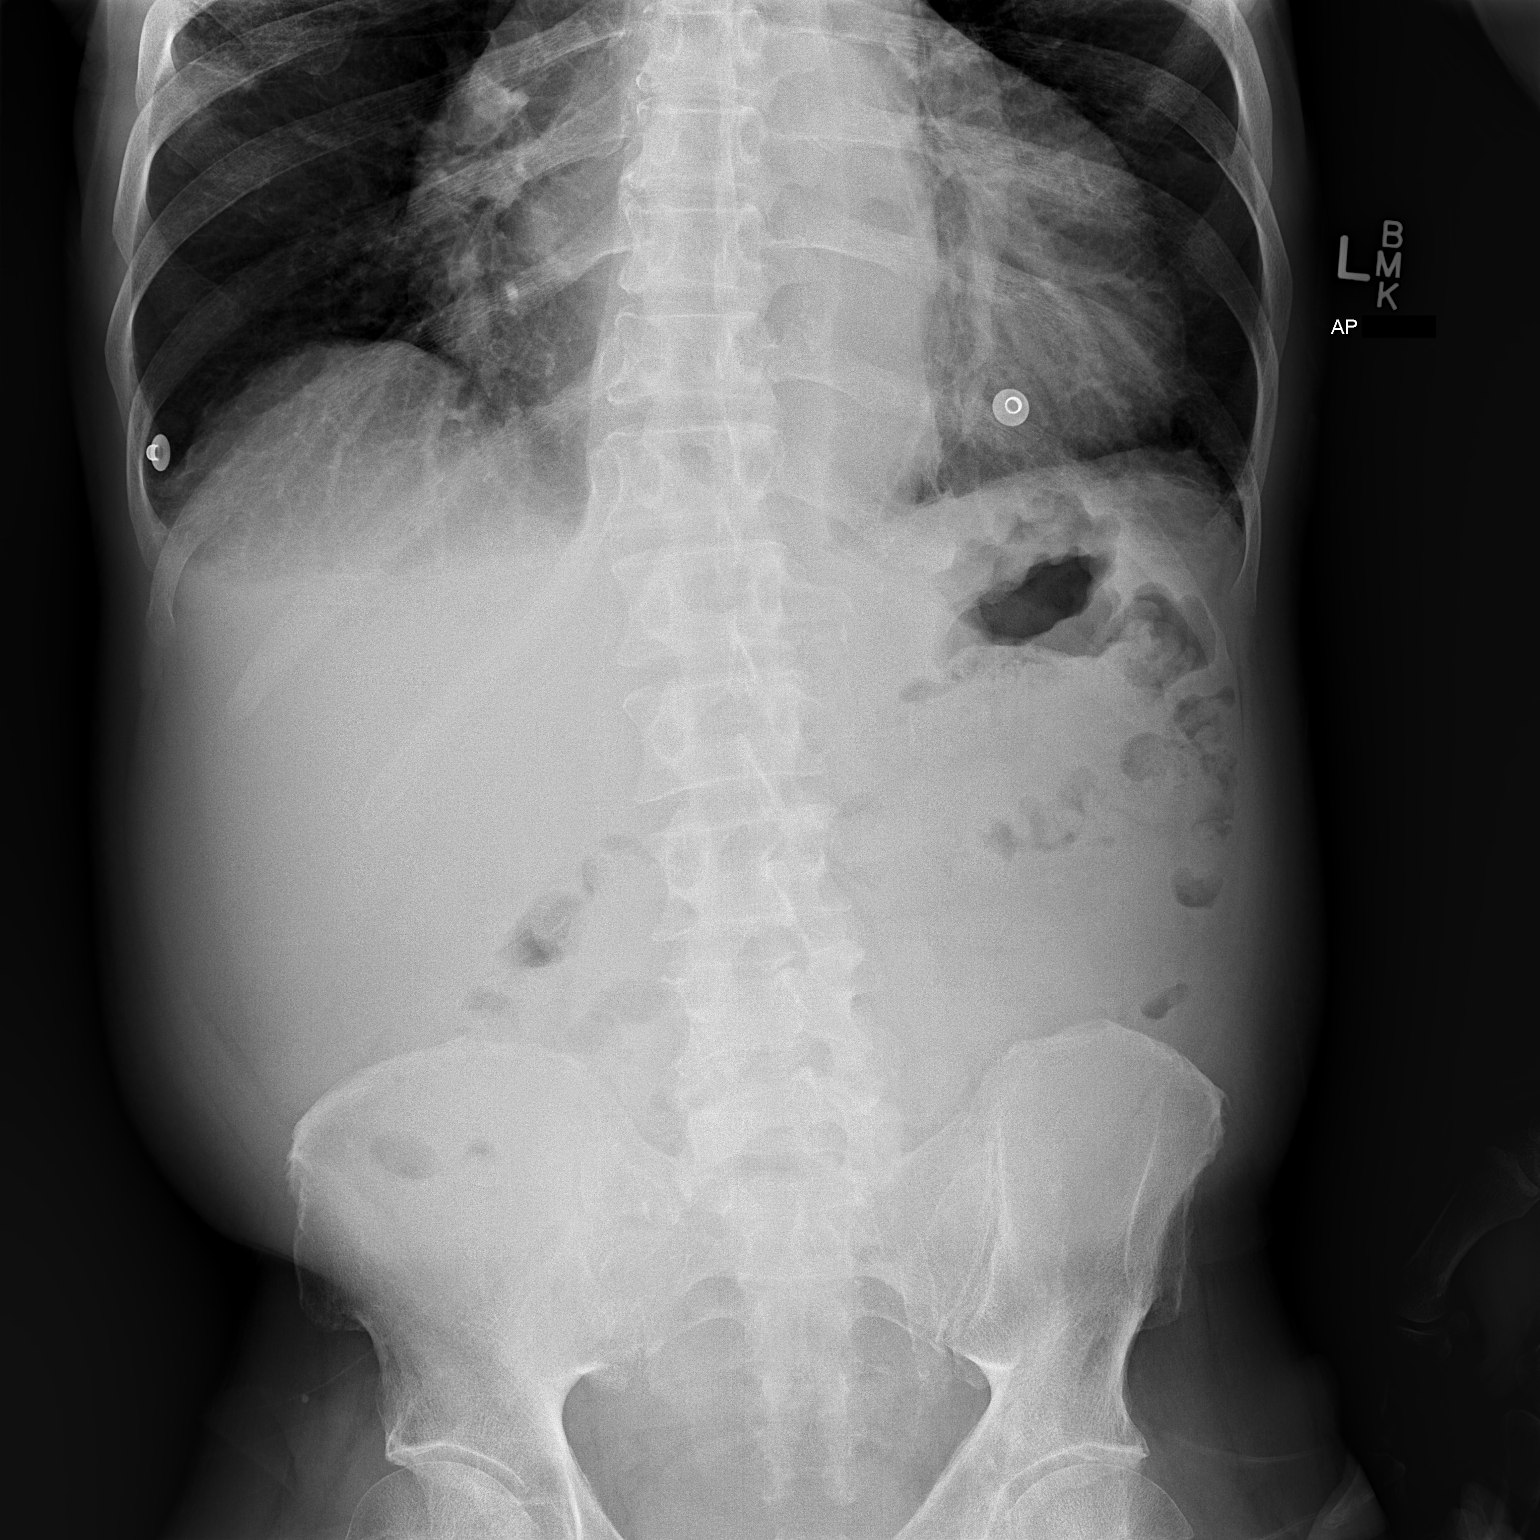
[im 2/2]
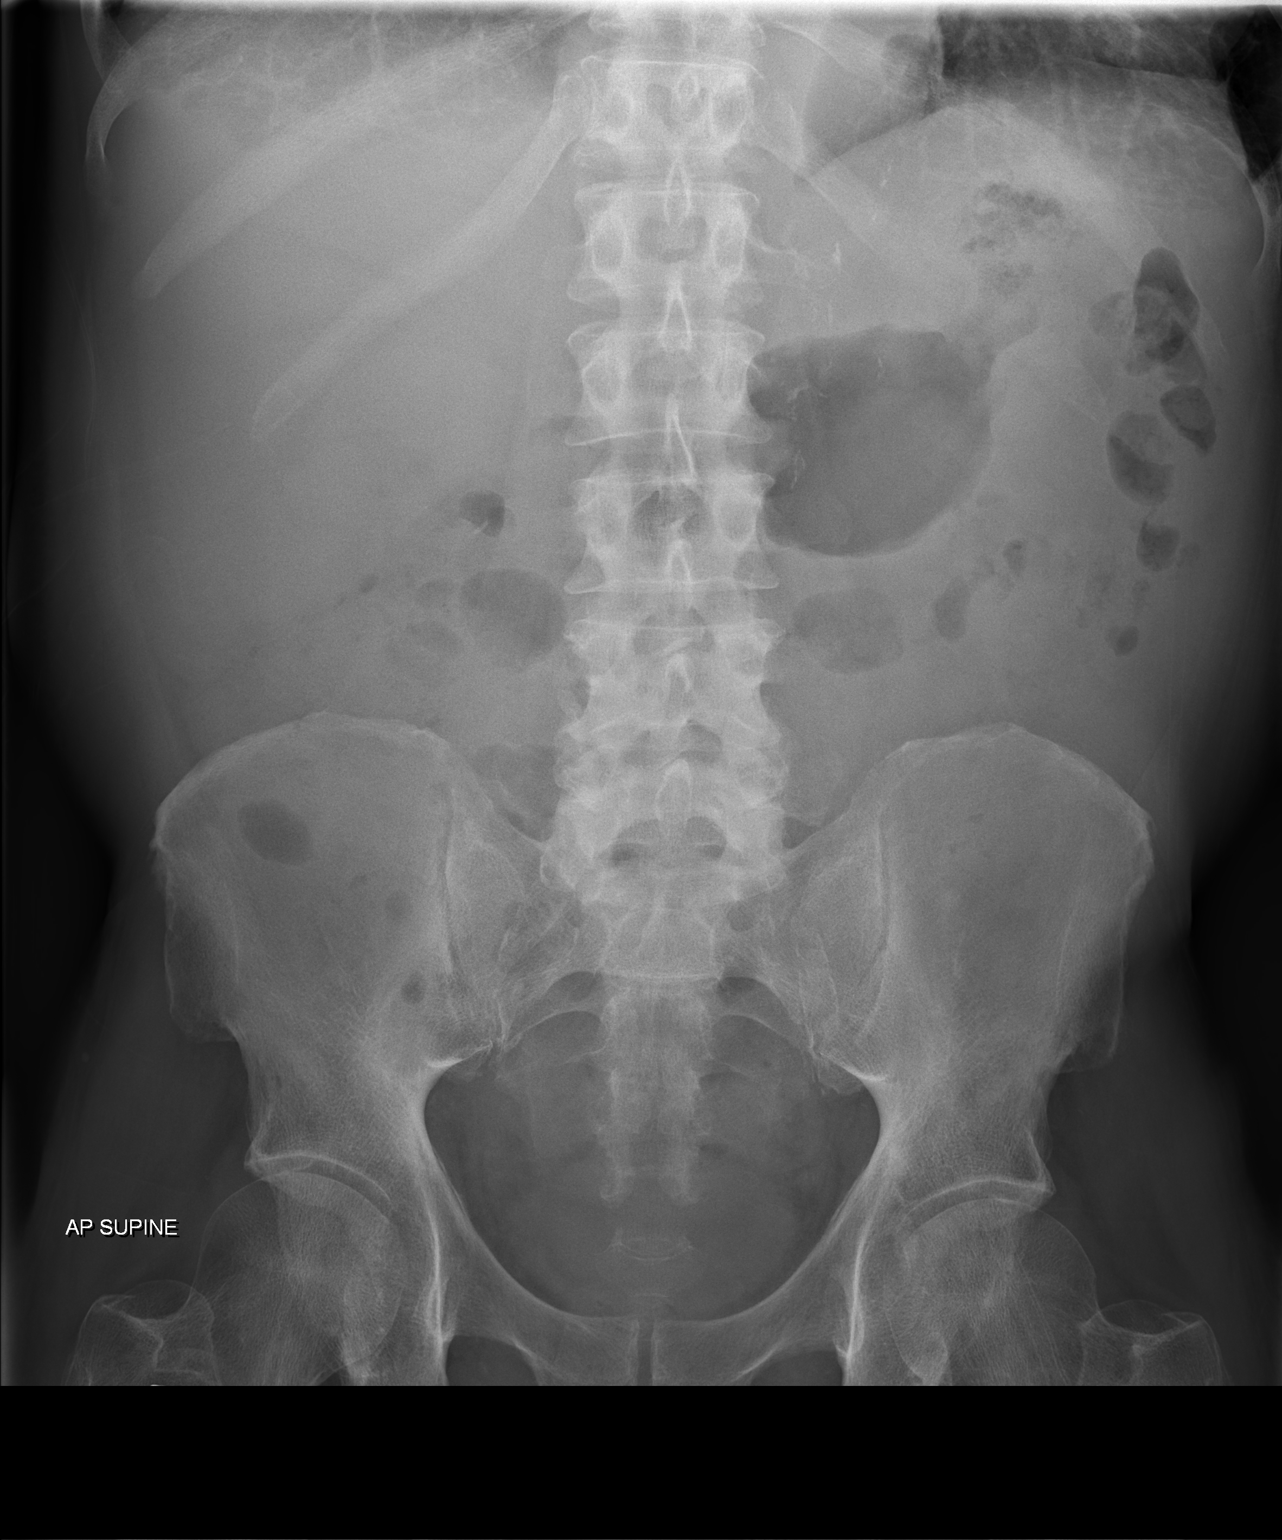

[2 of 2 positions shown; findings below may reference images not displayed]

FINDINGS: Evidence of cardiomegaly. Examination demonstrates a nonobstructive
bowel gas pattern. There is no free peritoneal air present. There
are no pathologic calcifications present. There are mild
degenerative changes of the spine and hips.
IMPRESSION: Nonobstructive bowel gas pattern.

## 2016-07-28 IMAGING — CT CT CHEST W/O CM
2 of 3 series · 15 of 36 positions shown, 18 images · non-contrast
Comparison: 08/07/2014; 05/02/2014

CLINICAL DATA: Fall. Dizziness. Shortness of breath. Congestion.
Pulmonary nodule posteriorly in the right upper lobe.

EXAM:
CT CHEST WITHOUT CONTRAST
TECHNIQUE: Multidetector CT imaging of the chest was performed following the
standard protocol without IV contrast..

[Series 2: routine chest wo · axial · 0.67mm/px · z∈[-416,-126]mm · 12 of 70 slices shown, 15 images]
[im 6/70  mediastinal]
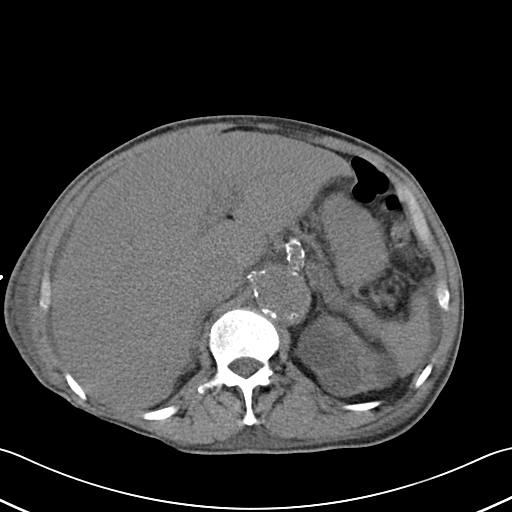
[im 6/70  lung]
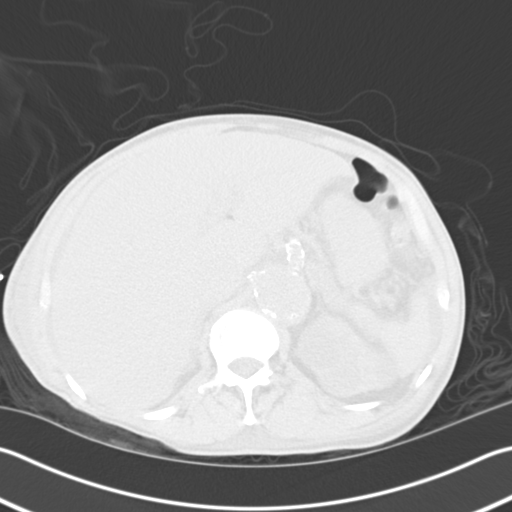
[im 11/70  lung]
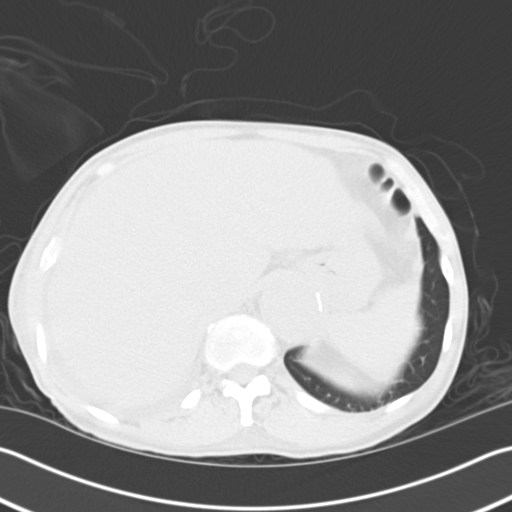
[im 16/70  lung]
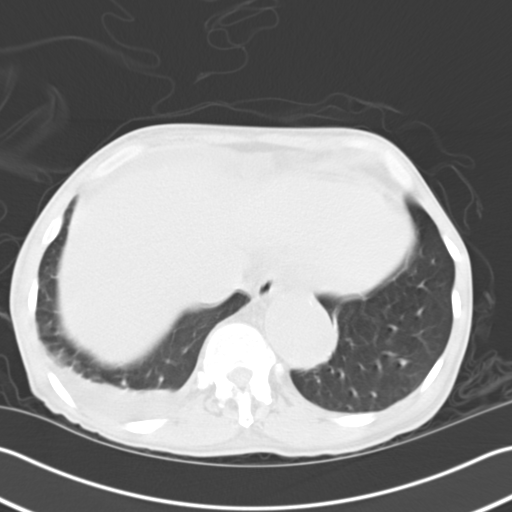
[im 21/70  lung]
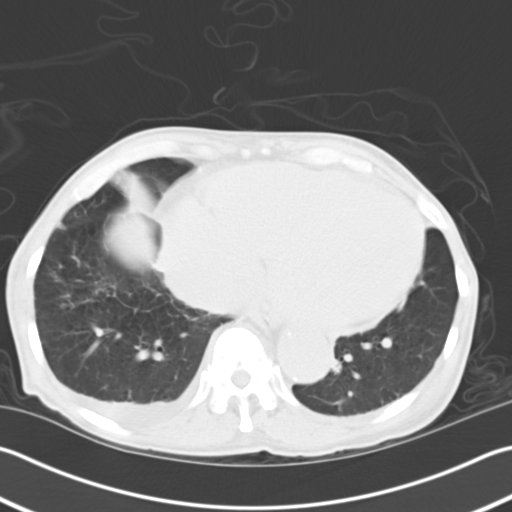
[im 26/70  mediastinal]
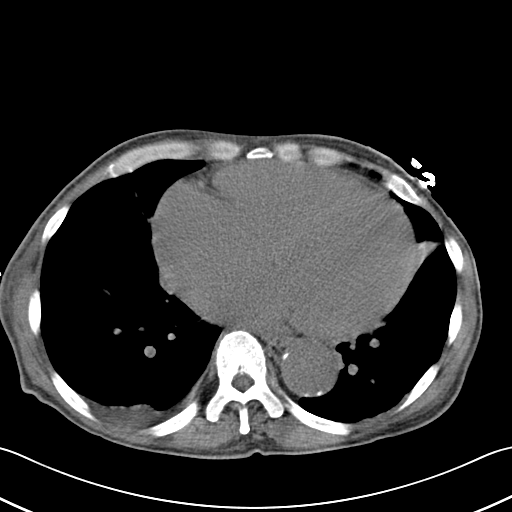
[im 26/70  lung]
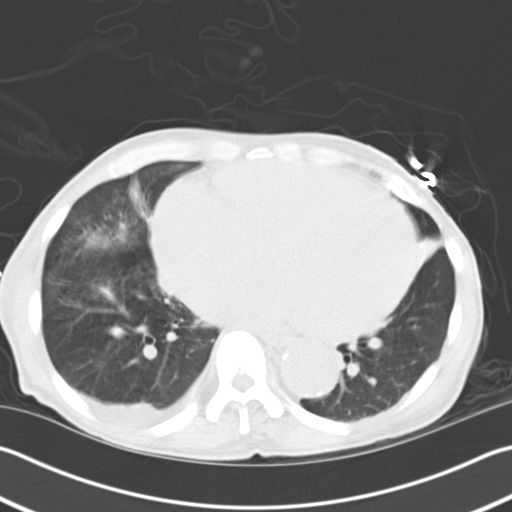
[im 31/70  lung]
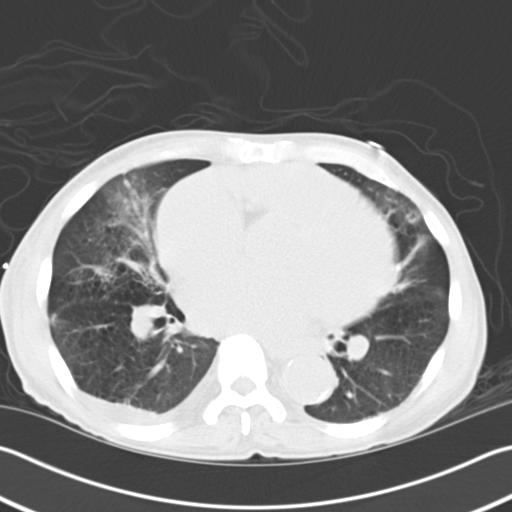
[im 39/70  lung]
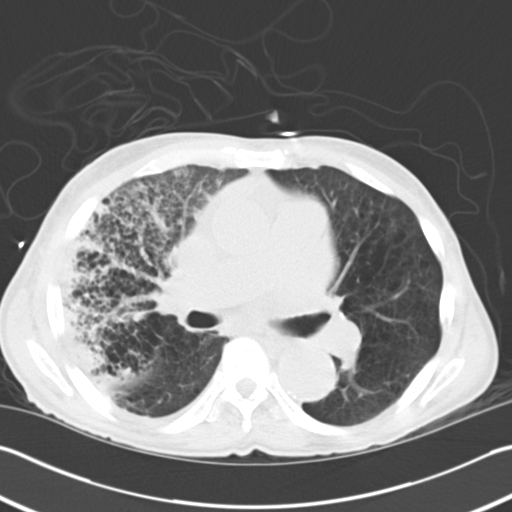
[im 44/70  lung]
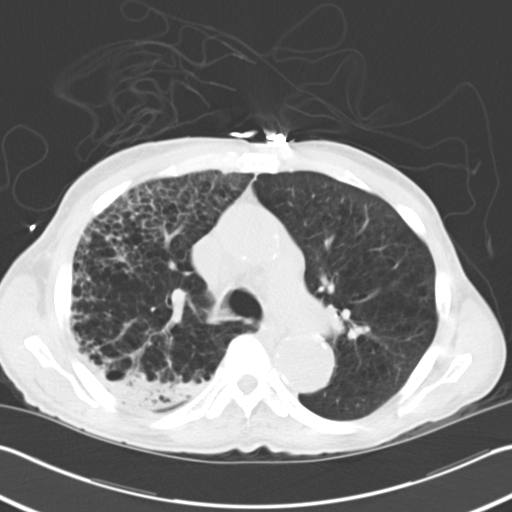
[im 49/70  mediastinal]
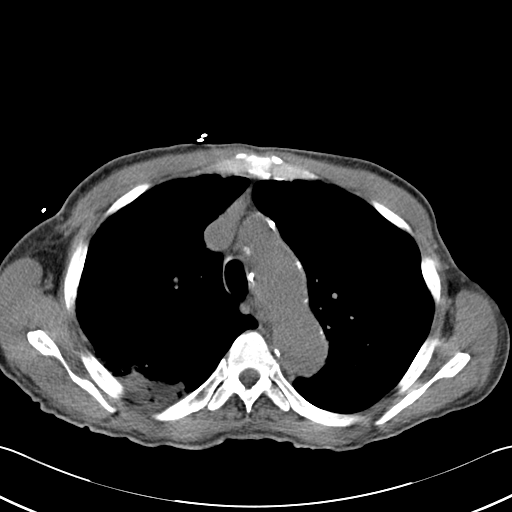
[im 49/70  lung]
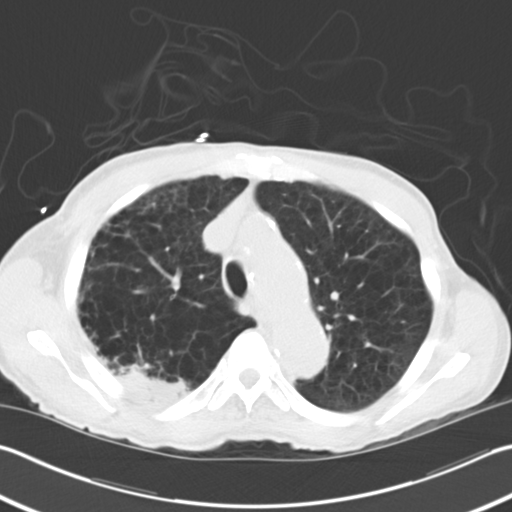
[im 54/70  lung]
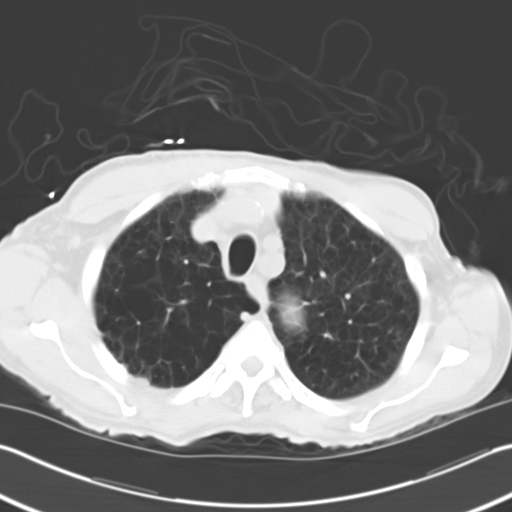
[im 59/70  lung]
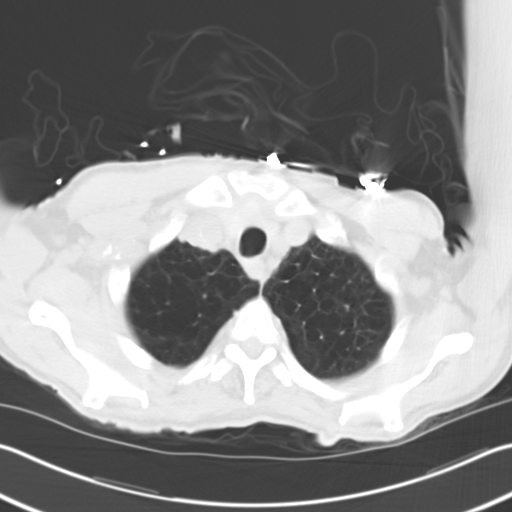
[im 64/70  lung]
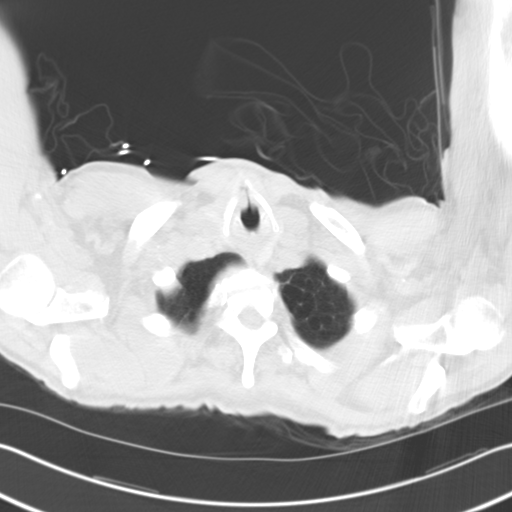

[Series 5: cor routine chest wo · coronal · 0.66mm/px · 3 of 112 slices shown]
[im 23/112  lung]
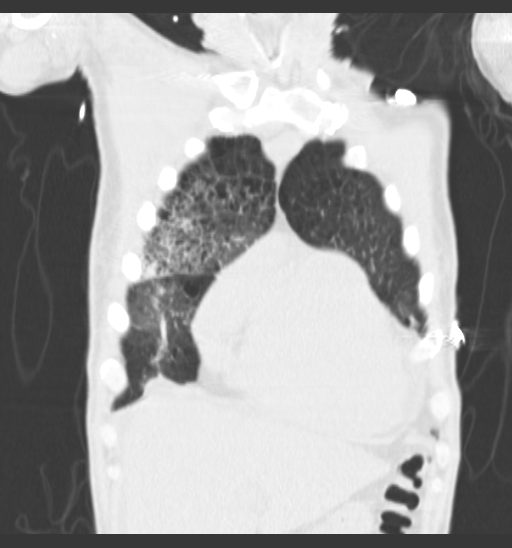
[im 45/112  lung]
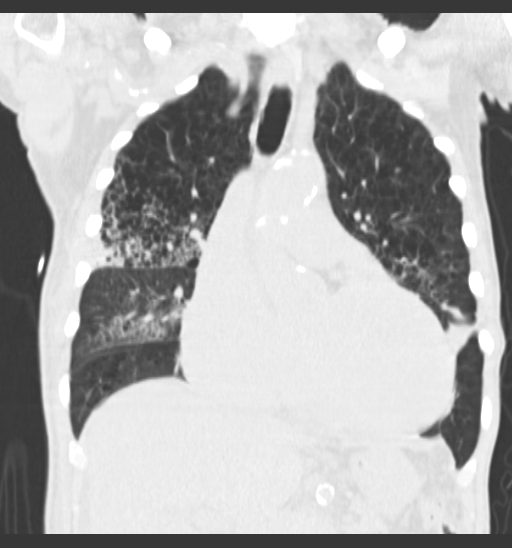
[im 67/112  lung]
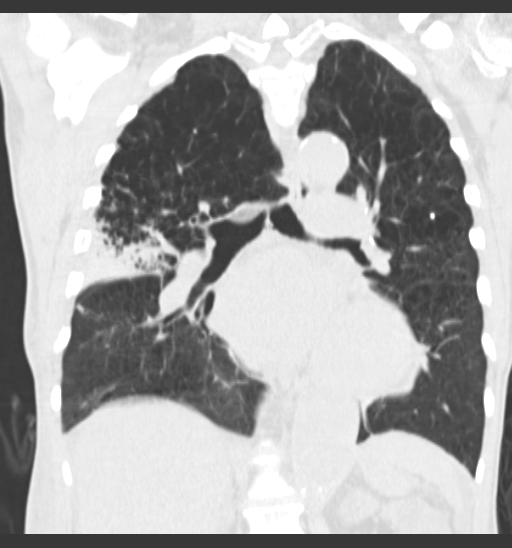

[15 of 36 positions shown; findings below may reference images not displayed]

FINDINGS: Atherosclerotic calcification of the thoracic aorta and coronary
arteries. Possible focal aneurysmal dilatation of the aortic arch
along the AP window. Moderate cardiomegaly. Small right pleural
effusion with passive atelectasis. Polycystic kidneys. Perihepatic
ascites with suspected mesenteric edema in the upper abdomen.
Perisplenic ascites.

The previous right upper lobe nodule is not obscured by surrounding
airspace opacity. Accordingly, I can' t obtain an accurate new
measurement. There is also airspace opacity throughout much of the
right upper lobe inferiorly causing consolidation against the
background appearance of severe emphysema. Airspace opacity is
present in the right middle lobe and to a much lesser extent in the
lingula and right lower lobe. No visible rib destruction or
compelling findings of chest wall invasion along the nodule.
IMPRESSION: 1. The right upper lobe pulmonary nodule is surrounded by airspace
opacity. The consolidation extends through much of the lower portion
of the right upper lobe, also with some airspace opacity in the
right middle lobe and to a much lesser extent in the lingula and
right lower lobe. Multilobar pneumonia is favored ; certainly a
component of this could be due to radiation pneumonitis if the
patient has been receiving radiation therapy. Asymmetric edema is
considered less likely given the degree of asymmetry.
2. Small right pleural effusion, nonspecific for malignant or
transudative versus exudative effusion.
3. Severe emphysema.
4. Considerable cardiomegaly and atherosclerosis. Suspected small
focal aneurysmal dilatation of the mid transverse aortic arch along
the AP window.
5. Upper abdominal ascites.
6. Polycystic kidneys.

## 2016-08-01 IMAGING — CR DG ABDOMEN 1V
1 series · 1 of 1 positions shown · non-contrast
Comparison: Abdominal radiograph 02/14/2014.

CLINICAL DATA: 73-year-old male with constipation and mid abdominal
discomfort.

EXAM:
ABDOMEN - 1 VIEW

[supine kub]
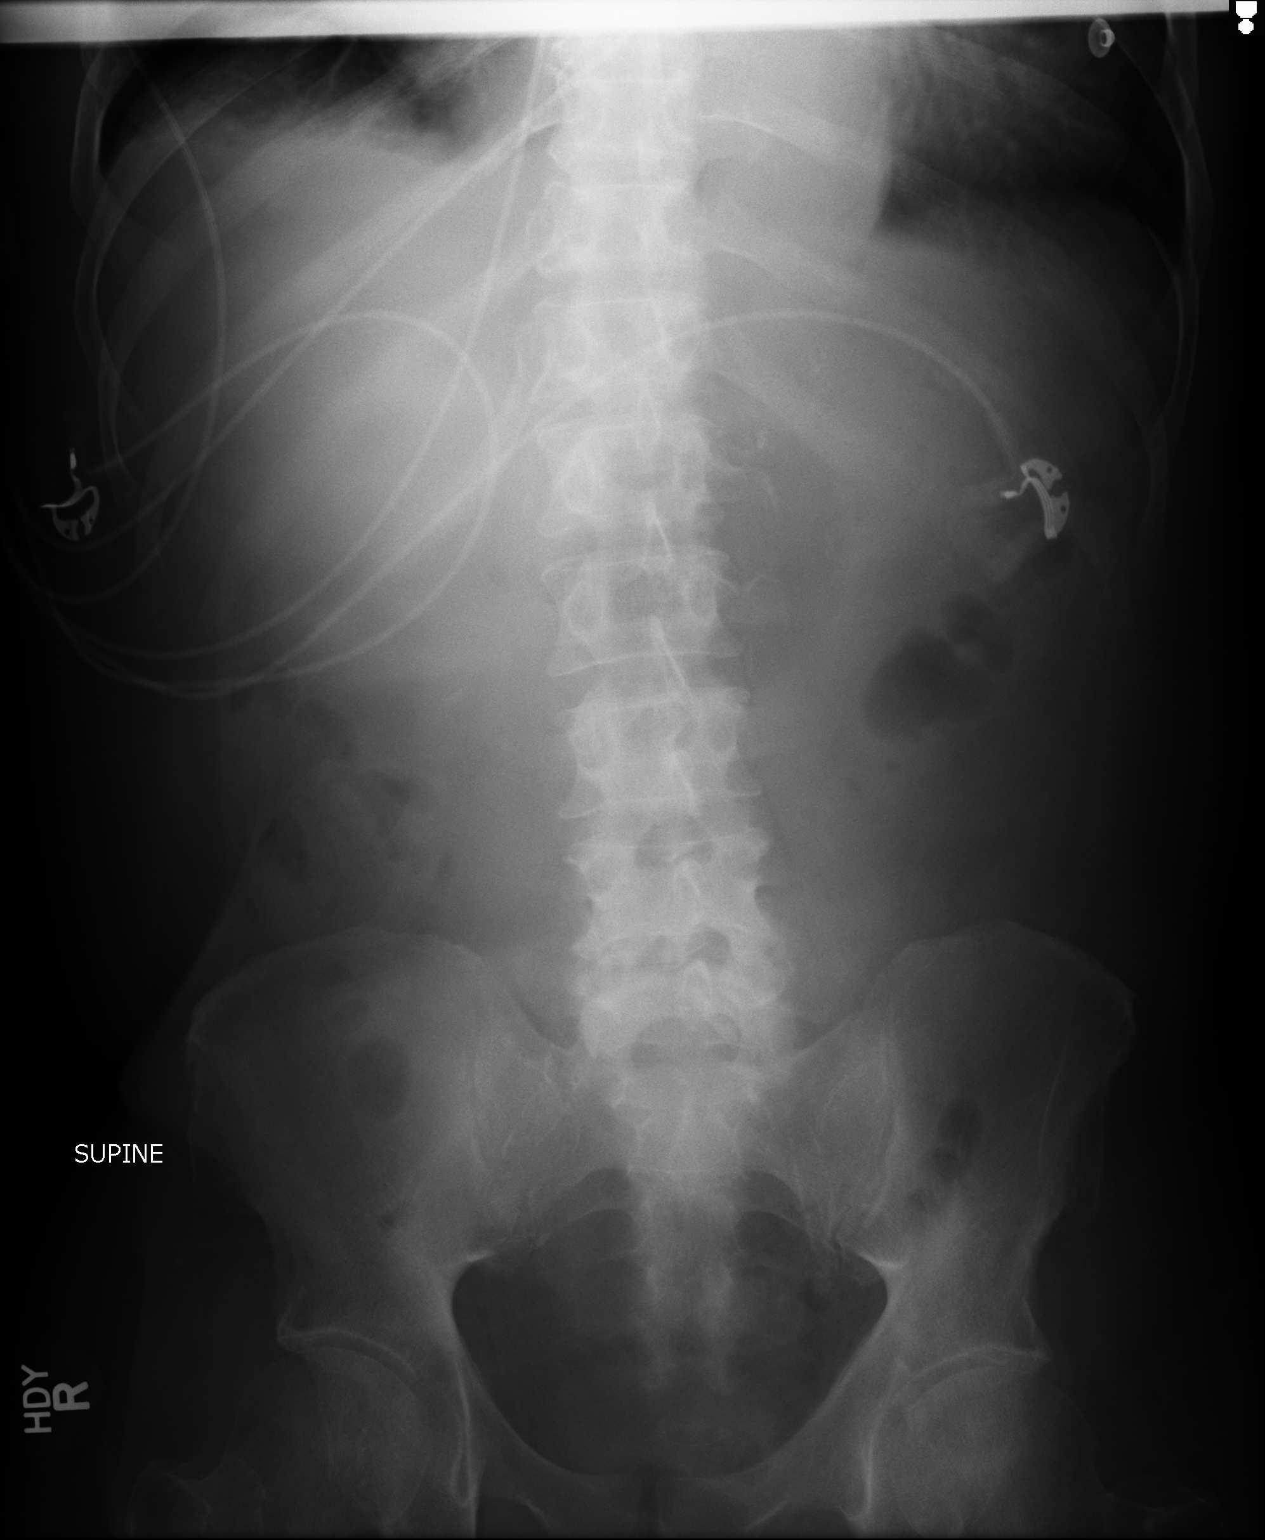

[1 of 1 positions shown; findings below may reference images not displayed]

FINDINGS: Gas and stool are seen scattered throughout the colon extending to
the level of the distal rectum. No pathologic distension of small
bowel is noted. No gross evidence of pneumoperitoneum.
IMPRESSION: 1.  Nonobstructive bowel gas pattern.
2. No pneumoperitoneum.

## 2019-09-03 DEATH — deceased
